# Patient Record
Sex: Male | Born: 1950 | Race: White | Hispanic: No | Marital: Married | Smoking: Never smoker
Health system: Southern US, Community
[De-identification: ages and names within clinical notes are randomized; demographics above are authoritative.]

## PROBLEM LIST (undated history)

## (undated) DIAGNOSIS — G4730 Sleep apnea, unspecified: Secondary | ICD-10-CM

## (undated) DIAGNOSIS — J302 Other seasonal allergic rhinitis: Secondary | ICD-10-CM

## (undated) DIAGNOSIS — G4733 Obstructive sleep apnea (adult) (pediatric): Secondary | ICD-10-CM

## (undated) DIAGNOSIS — T7840XA Allergy, unspecified, initial encounter: Secondary | ICD-10-CM

## (undated) HISTORY — DX: Other seasonal allergic rhinitis: J30.2

## (undated) HISTORY — DX: Obstructive sleep apnea (adult) (pediatric): G47.33

## (undated) HISTORY — PX: WISDOM TOOTH EXTRACTION: SHX21

## (undated) HISTORY — DX: Allergy, unspecified, initial encounter: T78.40XA

## (undated) HISTORY — DX: Sleep apnea, unspecified: G47.30

---

## 2005-09-20 HISTORY — PX: COLONOSCOPY: SHX174

## 2005-12-13 ENCOUNTER — Ambulatory Visit: Payer: Self-pay | Admitting: Internal Medicine

## 2005-12-15 ENCOUNTER — Ambulatory Visit: Payer: Self-pay | Admitting: Internal Medicine

## 2006-01-24 ENCOUNTER — Ambulatory Visit: Payer: Self-pay | Admitting: Gastroenterology

## 2006-02-03 ENCOUNTER — Ambulatory Visit: Payer: Self-pay | Admitting: Gastroenterology

## 2006-02-03 ENCOUNTER — Encounter (INDEPENDENT_AMBULATORY_CARE_PROVIDER_SITE_OTHER): Payer: Self-pay | Admitting: *Deleted

## 2008-01-19 ENCOUNTER — Telehealth: Payer: Self-pay | Admitting: Internal Medicine

## 2008-01-19 ENCOUNTER — Ambulatory Visit: Payer: Self-pay | Admitting: Internal Medicine

## 2008-01-19 DIAGNOSIS — R509 Fever, unspecified: Secondary | ICD-10-CM

## 2008-01-19 LAB — CONVERTED CEMR LAB
Blood in Urine, dipstick: NEGATIVE
Nitrite: NEGATIVE
Protein, U semiquant: NEGATIVE
Specific Gravity, Urine: 1.01
WBC Urine, dipstick: NEGATIVE

## 2008-01-20 ENCOUNTER — Encounter: Payer: Self-pay | Admitting: Internal Medicine

## 2008-01-22 ENCOUNTER — Telehealth (INDEPENDENT_AMBULATORY_CARE_PROVIDER_SITE_OTHER): Payer: Self-pay | Admitting: *Deleted

## 2008-01-22 LAB — CONVERTED CEMR LAB
AST: 27 units/L (ref 0–37)
Alkaline Phosphatase: 122 units/L — ABNORMAL HIGH (ref 39–117)
Basophils Absolute: 0 10*3/uL (ref 0.0–0.1)
Bilirubin, Direct: 0.1 mg/dL (ref 0.0–0.3)
Chloride: 103 meq/L (ref 96–112)
Eosinophils Absolute: 0.2 10*3/uL (ref 0.0–0.7)
GFR calc non Af Amer: 83 mL/min
MCHC: 33.7 g/dL (ref 30.0–36.0)
MCV: 90.5 fL (ref 78.0–100.0)
Neutrophils Relative %: 75.9 % (ref 43.0–77.0)
Platelets: 152 10*3/uL (ref 150–400)
Potassium: 4 meq/L (ref 3.5–5.1)
Sodium: 140 meq/L (ref 135–145)
Total Bilirubin: 0.8 mg/dL (ref 0.3–1.2)

## 2011-02-26 ENCOUNTER — Encounter: Payer: Self-pay | Admitting: Gastroenterology

## 2011-12-23 ENCOUNTER — Ambulatory Visit (INDEPENDENT_AMBULATORY_CARE_PROVIDER_SITE_OTHER): Payer: BC Managed Care – PPO | Admitting: Family Medicine

## 2011-12-23 VITALS — BP 134/80 | HR 71 | Temp 98.0°F | Wt 174.0 lb

## 2011-12-23 DIAGNOSIS — J209 Acute bronchitis, unspecified: Secondary | ICD-10-CM

## 2011-12-23 MED ORDER — BENZONATATE 200 MG PO CAPS: 200.0000 mg | ORAL_CAPSULE | Freq: Three times a day (TID) | ORAL | Status: AC | PRN

## 2011-12-23 MED ORDER — AZITHROMYCIN 250 MG PO TABS: ORAL_TABLET | ORAL | Status: AC

## 2011-12-23 NOTE — Progress Notes (Signed)
  Subjective:    Patient ID: Radley Barto, male    DOB: 1951/02/19, 61 y.o.   MRN: [id number]  HPI Cough/congestion- sxs started 6 weeks ago.  No fever.  Started OTC cold and sinus meds w/ some improvement but then sxs again returned.  Started taking Zyrtec daily w/ some improvement.  Cough persists- intermittently productive.  No ear pain, sinus pain.  + nasal congestion.  + sick contacts.   Review of Systems For ROS see HPI     Objective:   Physical Exam  Constitutional: He appears well-developed and well-nourished. No distress.  HENT:  Head: Normocephalic and atraumatic.       No TTP over sinuses + turbinate edema + PND TMs normal bilaterally  Eyes: Conjunctivae and EOM are normal. Pupils are equal, round, and reactive to light.  Neck: Normal range of motion. Neck supple.  Cardiovascular: Normal rate, regular rhythm and normal heart sounds.   Pulmonary/Chest: Effort normal and breath sounds normal. No respiratory distress. He has no wheezes.       + deep cough  Lymphadenopathy:    He has no cervical adenopathy.  Skin: Skin is warm and dry.          Assessment & Plan:

## 2011-12-23 NOTE — Assessment & Plan Note (Signed)
New.  Pt's sxs consistent w/ allergy/bronchitis combo.  Continue OTC antihistamine and add abx due to duration of illness.  Reviewed supportive care and red flags that should prompt return.  Pt expressed understanding and is in agreement w/ plan.

## 2011-12-23 NOTE — Patient Instructions (Signed)
Start the azithromycin to cover for possible bronchitis after 6 weeks of cough Use the tessalon as needed for cough Continue the Zyrtec daily Drink plenty of fluids Hang in there!

## 2012-12-12 ENCOUNTER — Encounter: Payer: Self-pay | Admitting: Internal Medicine

## 2012-12-12 ENCOUNTER — Ambulatory Visit (INDEPENDENT_AMBULATORY_CARE_PROVIDER_SITE_OTHER): Payer: BC Managed Care – PPO | Admitting: Internal Medicine

## 2012-12-12 VITALS — BP 134/74 | HR 79 | Temp 97.6°F | Wt 176.0 lb

## 2012-12-12 DIAGNOSIS — J209 Acute bronchitis, unspecified: Secondary | ICD-10-CM

## 2012-12-12 MED ORDER — AZITHROMYCIN 250 MG PO TABS: ORAL_TABLET | ORAL | Status: DC

## 2012-12-12 NOTE — Progress Notes (Signed)
  Subjective:    Patient ID: Alexis Chan, male    DOB: [birth year], 62 y.o.   MRN: [id number]  HPI Acute visit 10 days history of cough, sinus congestion, sore throat. Taking Advil cold and sinus.  No past medical history on file.   Past Surgical History  Procedure Laterality Date  . No past surgeries       History   Social History  . Marital Status: Married    Spouse Name: N/A    Number of Children: 0  . Years of Education: N/A   Occupational History  . [employer]    Social History Main Topics  . Smoking status: Never Smoker   . Smokeless tobacco: Never Used  . Alcohol Use: No  . Drug Use: No  . Sexually Active: Not on file   Other Topics Concern  . Not on file   Social History Narrative  . No narrative on file    Review of Systems No nasal discharge, occasional sputum production, yellow in color. Having some subjective fevers in the last few days. No nausea, vomiting, diarrhea. No myalgias.     Objective:   Physical Exam General -- alert, well-developed, no apparent distress  HEENT -- TMs normal, throat w/o redness, face symmetric and not tender to palpation, nose not congested  Lungs -- normal respiratory effort, no intercostal retractions, no accessory muscle use, and normal breath sounds.   Heart-- normal rate, regular rhythm, no murmur, and no gallop.    Neurologic-- alert & oriented X3 and strength normal in all extremities. Psych-- Cognition and judgment appear intact. Alert and cooperative with normal attention span and concentration.  not anxious appearing and not depressed appearing.      Assessment & Plan:   Mild bronchitis, see instructions. Recommend to schedule a physical exam at his convenience.

## 2012-12-12 NOTE — Patient Instructions (Addendum)
Rest, fluids , tylenol For cough, take Mucinex DM twice a day as needed or Advil cold and sinus Take the antibiotic as prescribed  (zithromax ) only if no better in 3-4 days Call if no better by next week Call anytime if the symptoms are severe --- Schedule a physical exam fasting at your convenience

## 2012-12-28 ENCOUNTER — Encounter: Payer: Self-pay | Admitting: Internal Medicine

## 2012-12-28 ENCOUNTER — Ambulatory Visit (INDEPENDENT_AMBULATORY_CARE_PROVIDER_SITE_OTHER): Payer: BC Managed Care – PPO | Admitting: Internal Medicine

## 2012-12-28 VITALS — BP 126/78 | HR 92 | Temp 98.3°F | Wt 173.0 lb

## 2012-12-28 DIAGNOSIS — L0390 Cellulitis, unspecified: Secondary | ICD-10-CM

## 2012-12-28 DIAGNOSIS — T148 Other injury of unspecified body region: Secondary | ICD-10-CM

## 2012-12-28 DIAGNOSIS — Z23 Encounter for immunization: Secondary | ICD-10-CM

## 2012-12-28 DIAGNOSIS — R0609 Other forms of dyspnea: Secondary | ICD-10-CM

## 2012-12-28 DIAGNOSIS — R0683 Snoring: Secondary | ICD-10-CM

## 2012-12-28 DIAGNOSIS — W57XXXA Bitten or stung by nonvenomous insect and other nonvenomous arthropods, initial encounter: Secondary | ICD-10-CM

## 2012-12-28 DIAGNOSIS — J309 Allergic rhinitis, unspecified: Secondary | ICD-10-CM

## 2012-12-28 MED ORDER — DOXYCYCLINE HYCLATE 100 MG PO TABS: 100.0000 mg | ORAL_TABLET | Freq: Two times a day (BID) | ORAL | Status: DC

## 2012-12-28 MED ORDER — FLUTICASONE PROPIONATE 50 MCG/ACT NA SUSP: 1.0000 | Freq: Two times a day (BID) | NASAL | Status: DC | PRN

## 2012-12-28 NOTE — Progress Notes (Signed)
  Subjective:    Patient ID: Alexis Chan, male    DOB: 1951-02-03, 62 y.o.   MRN: [id number]  HPI  He noted itching in the left flank area 12/23/12. His wife noted a tick which he removed with tweezers. The itching resolved with Benadryl.  He denies associated nausea, vomiting, fever, or chills. He also has not had significant headache or progressive rash.  His wife didn't want him checked as there has been a persistent lesion at the site of the bite.    Review of Systems  He was recently treated for bronchitis with a Z-Pak which he has completed. He also has been using Zyrtec-D for extrinsic symptoms. 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  His wife is concerned because of significant snoring and questions sleep apnea     Objective:   Physical Exam  General appearance:good health ;well nourished; no acute distress or increased work of breathing is present.  No  lymphadenopathy about the head, neck, or axilla noted.   Eyes: No conjunctival inflammation or lid edema is present.   Ears:  External ear exam shows no significant lesions or deformities.  Otoscopic examination reveals clear canals, tympanic membranes are intact bilaterally without bulging, retraction, inflammation or discharge.  Nose:  External nasal examination shows no deformity or inflammation. Nasal mucosa are pink and moist without lesions or exudates. No septal dislocation or deviation.No obstruction to airflow.   Oral exam: Dental hygiene is good; lips and gums are healthy appearing.There is no oropharyngeal erythema or exudate noted.   Neck:  No deformities, masses, or tenderness noted.   Supple with full range of motion without pain.   Heart:  Normal rate and regular rhythm. S1 and S2 normal without gallop, murmur, click, rub or other extra sounds.   Lungs:Chest clear to auscultation; no wheezes, rhonchi,rales ,or rubs present.No increased work of breathing.    Extremities:  No  cyanosis, edema, or clubbing  noted    Skin: 1.5 X 2 cm L flank cellulitis.         Assessment & Plan:  #1 cellulitis at the site of tick bite; active tick borne illness clinically not present  #2 snoring, excessive by history he  Plan: See orders and recommendations

## 2012-12-28 NOTE — Patient Instructions (Addendum)
Plain Mucinex (NOT D) for thick secretions ;force NON dairy fluids .   Nasal cleansing in the shower as discussed with lather of mild shampoo.After 10 seconds wash off lather while  exhaling through nostrils. Make sure that all residual soap is removed to prevent irritation.  Fluticasone 1 spray in each nostril twice a day as needed. Use the "crossover" technique into opposite nostril spraying toward opposite ear @ 45 degree angle, not straight up into nostril.  Use a Neti pot daily only  as needed for significant sinus congestion; going from open side to congested side . Plain Allegra (NOT D )  160 daily , Loratidine 10 mg , OR Zyrtec 10 mg @ bedtime  as needed for itchy eyes & sneezing. With Doxycycline direct sun exposure should be avoided as much as possible as photosensitivity skin rash  is a potential reaction.

## 2013-01-30 ENCOUNTER — Encounter: Payer: Self-pay | Admitting: Pulmonary Disease

## 2013-01-30 ENCOUNTER — Ambulatory Visit (INDEPENDENT_AMBULATORY_CARE_PROVIDER_SITE_OTHER): Payer: BC Managed Care – PPO | Admitting: Pulmonary Disease

## 2013-01-30 VITALS — BP 122/62 | HR 75 | Temp 98.0°F | Ht 68.0 in | Wt 172.0 lb

## 2013-01-30 DIAGNOSIS — G4733 Obstructive sleep apnea (adult) (pediatric): Secondary | ICD-10-CM

## 2013-01-30 DIAGNOSIS — R0683 Snoring: Secondary | ICD-10-CM

## 2013-01-30 DIAGNOSIS — R0609 Other forms of dyspnea: Secondary | ICD-10-CM

## 2013-01-30 HISTORY — DX: Obstructive sleep apnea (adult) (pediatric): G47.33

## 2013-01-30 NOTE — Assessment & Plan Note (Signed)
He has snoring, sleep disruption, witnessed apnea, and daytime sleepiness.  I am concerned he could have sleep apnea.  We discussed how sleep apnea can affect various health problems including risks for hypertension, cardiovascular disease, and diabetes.  We also discussed how sleep disruption can increase risks for accident, such as while driving.  Weight loss as a means of improving sleep apnea was also reviewed.  Additional treatment options discussed were CPAP therapy, oral appliance, and surgical intervention.  To further assess will arrange for in lab sleep study.  

## 2013-01-30 NOTE — Patient Instructions (Signed)
Will arrange for sleep study Will call to arrange for follow up after sleep study reviewed 

## 2013-01-30 NOTE — Progress Notes (Signed)
Chief Complaint  Patient presents with  . Advice Only    Per spouse her stops breathing at night and he snores. At times he wakes up tired in the mornings.     History of Present Illness: Alexis Chan is a 62 y.o. male for evaluation of sleep problems.  During recent visit with PCP his wife made a comment that he snores, and stops breathing while asleep.  He will then wake up briefly and catch his breath.  He is not aware of this.  He does get mouth dryness at night, and has trouble sleeping on his back.  He does not dream much. He will fall asleep if he is reading or working on a computer.  He is a restless sleeper.  He goes to sleep at 12 am.  He falls asleep quickly.  He wakes up 2 times to use the bathroom.  He gets out of bed at 730 am.  He occasionally feels tired in the morning.  He started using B vitamin supplement, and this has helped his energy level.  He denies morning headache.  He does not use anything to help him fall sleep.  He drinks tea in the morning.  He denies sleep walking, sleep talking, bruxism, or nightmares.  There is no history of restless legs.  He denies sleep hallucinations, sleep paralysis, or cataplexy.  The Epworth score is 5 out of 24.  Tests:   Alexis Chan  has a past medical history of Seasonal allergies.  Alexis Chan  has past surgical history that includes No past surgeries.  Prior to Admission medications   Medication Sig Start Date End Date Taking? Authorizing Provider  Cetirizine HCl (ZYRTEC ALLERGY PO) Take by mouth daily.   Yes Historical Provider, MD  fluticasone (FLONASE) 50 MCG/ACT nasal spray Place 1 spray into the nose 2 (two) times daily as needed for rhinitis. 12/28/12  Yes Pecola Lawless, MD  Multiple Vitamin (MULTIVITAMIN) tablet Take 1 tablet by mouth daily.   Yes Historical Provider, MD    No Known Allergies  His family history is not on file.  He  reports that he has never smoked. He has never used  smokeless tobacco. He reports that he does not drink alcohol or use illicit drugs.  Review of Systems  Constitutional: Negative for appetite change and unexpected weight change.  HENT: Negative for ear pain, congestion, sore throat, sneezing, trouble swallowing and dental problem.   Respiratory: Negative for cough and shortness of breath.   Cardiovascular: Negative for chest pain, palpitations and leg swelling.  Gastrointestinal: Negative for abdominal pain.  Musculoskeletal: Negative for joint swelling.  Skin: Negative for rash.  Neurological: Negative for headaches.  Psychiatric/Behavioral: Negative for dysphoric mood. The patient is not nervous/anxious.    Physical Exam:  General - No distress ENT - No sinus tenderness, no oral exudate, MP 2, decreased AP diameter, no LAN, no thyromegaly, TM clear, pupils equal/reactive Cardiac - s1s2 regular, no murmur, pulses symmetric Chest - No wheeze/rales/dullness, good air entry, normal respiratory excursion Back - No focal tenderness Abd - Soft, non-tender, no organomegaly, + bowel sounds Ext - No edema Neuro - Normal strength, cranial nerves intact Skin - No rashes Psych - Normal mood, and behavior

## 2013-01-30 NOTE — Progress Notes (Deleted)
  Subjective:    Patient ID: Alexis Chan, male    DOB: 02/11/1951, 62 y.o.   MRN: [id number]  HPI    Review of Systems  Constitutional: Negative for appetite change and unexpected weight change.  HENT: Negative for ear pain, congestion, sore throat, sneezing, trouble swallowing and dental problem.   Respiratory: Negative for cough and shortness of breath.   Cardiovascular: Negative for chest pain, palpitations and leg swelling.  Gastrointestinal: Negative for abdominal pain.  Musculoskeletal: Negative for joint swelling.  Skin: Negative for rash.  Neurological: Negative for headaches.  Psychiatric/Behavioral: Negative for dysphoric mood. The patient is not nervous/anxious.        Objective:   Physical Exam        Assessment & Plan:

## 2013-02-19 ENCOUNTER — Ambulatory Visit (HOSPITAL_BASED_OUTPATIENT_CLINIC_OR_DEPARTMENT_OTHER): Payer: BC Managed Care – PPO | Attending: Pulmonary Disease | Admitting: Radiology

## 2013-02-19 VITALS — Ht 68.0 in | Wt 165.0 lb

## 2013-02-19 DIAGNOSIS — G4733 Obstructive sleep apnea (adult) (pediatric): Secondary | ICD-10-CM | POA: Insufficient documentation

## 2013-02-19 DIAGNOSIS — R0683 Snoring: Secondary | ICD-10-CM

## 2013-02-19 DIAGNOSIS — R0609 Other forms of dyspnea: Secondary | ICD-10-CM | POA: Insufficient documentation

## 2013-02-19 DIAGNOSIS — R0989 Other specified symptoms and signs involving the circulatory and respiratory systems: Secondary | ICD-10-CM | POA: Insufficient documentation

## 2013-02-19 DIAGNOSIS — G4761 Periodic limb movement disorder: Secondary | ICD-10-CM | POA: Insufficient documentation

## 2013-02-28 ENCOUNTER — Telehealth: Payer: Self-pay | Admitting: Pulmonary Disease

## 2013-02-28 DIAGNOSIS — G4761 Periodic limb movement disorder: Secondary | ICD-10-CM

## 2013-02-28 DIAGNOSIS — G4733 Obstructive sleep apnea (adult) (pediatric): Secondary | ICD-10-CM

## 2013-02-28 NOTE — Telephone Encounter (Signed)
Spoke to pt. He has been scheduled for 03/16/13 at 2:00pm. Nothing further was needed.

## 2013-02-28 NOTE — Telephone Encounter (Signed)
PSG 02/19/13 >> AHI 33.3, SpO2 low 78%, PLMI 20.  REM AHI 60, Supine AHI 50.  Will have my nurse schedule ROV to review results >> okay to double book visit.

## 2013-03-01 NOTE — Procedures (Signed)
NAME:  Alexis Chan, Alexis Chan NO.:  [id number]  MEDICAL RECORD NO.:  [id number]          PATIENT TYPE:  OUT  LOCATION:  SLEEP CENTER                 FACILITY:  [facility]  PHYSICIAN:  Coralyn Helling, MD        DATE OF BIRTH:  10/06/1950  DATE OF STUDY:  02/19/2013                           NOCTURNAL POLYSOMNOGRAM  REFERRING PHYSICIAN:  Coralyn Helling, MD  INDICATION FOR STUDY:  Mr. Mazon is a 62 year old male, who reports snoring, sleep disruption, and daytime sleepiness.  He is referred to sleep lab for evaluation of hypersomnia with obstructive sleep apnea.  Height is 5 feet 8 inches, weight is 165 pounds, BMI is 25, neck size is 15 inches.  EPWORTH SLEEPINESS SCORE:  7.  MEDICATIONS:  Zyrtec.  SLEEP ARCHITECTURE:  Total recording time was 378 minutes.  Total sleep time was 324 minutes.  Sleep efficiency was 84%.  Sleep latency was 15 minutes.  REM latency was 284 minutes.  The study was notable for lack of slow-wave sleep.  He slept in both the supine and nonsupine positions.  RESPIRATORY DATA:  The average respiratory rate was 18.  Moderate snoring was noted by the technician.  The overall apnea/hypopnea index was 33.3.  There were 6 central apneic events, and 3 mixed respiratory events.  The remainder of the events were obstructive in nature.  The supine apnea-hypopnea index was 15.  The nonsupine apnea-hypopnea index was 0.7.  The REM apnea-hypopnea index was 60.  The non-REM apnea- hypopnea index was 29.4.  OXYGEN DATA:  The baseline oxygenation was 94%.  The oxygen saturation nadir was 78%.  The patient spent a total of 9.6 minutes with an oxygen saturation below 80%.  The study was conducted without the use of supplemental oxygen.  CARDIAC DATA:  The average heart rate was 72 and the rhythm strip showed sinus rhythm with occasional PVCs and PACs.  MOVEMENT-PARASOMNIA:  The periodic limb movement index was 20 and the patient had no restroom  trips.  IMPRESSION:  This study shows evidence for severe obstructive sleep apnea with an apnea/hypopnea index of 33.3 and an oxygen saturation nadir of 78%.  He did have a significant REM and positional effect to sleep- disordered breathing.  He had an increase in his periodic limb movement index and clinical correlation would be necessary to determine the significance of this.  Additional therapeutic interventions for his obstructive sleep apnea could include positional therapy, CPAP therapy, oral appliance, or surgical intervention.     Coralyn Helling, MD Diplomat, American Board of Sleep Medicine    VS/MEDQ  D:  02/28/2013 11:14:07  T:  03/01/2013 04:25:18  Job:  [id number]

## 2013-03-16 ENCOUNTER — Encounter: Payer: Self-pay | Admitting: Pulmonary Disease

## 2013-03-16 ENCOUNTER — Ambulatory Visit (INDEPENDENT_AMBULATORY_CARE_PROVIDER_SITE_OTHER): Payer: BC Managed Care – PPO | Admitting: Pulmonary Disease

## 2013-03-16 VITALS — BP 116/70 | HR 59 | Temp 97.9°F | Ht 68.0 in | Wt 173.0 lb

## 2013-03-16 DIAGNOSIS — G4733 Obstructive sleep apnea (adult) (pediatric): Secondary | ICD-10-CM

## 2013-03-16 NOTE — Progress Notes (Signed)
Chief Complaint  Patient presents with  . Sleep Apnea    Sleep Study Results    History of Present Illness: Alexis Chan is a 62 y.o. male with OSA.  He is here to review his sleep study.  This showed severe sleep apnea.  TESTS: PSG 02/19/13 >> AHI 33.3, SpO2 low 78%, PLMI 20.  REM AHI 60, Supine AHI 50.  Alexis Chan  has a past medical history of Seasonal allergies.  Alexis Chan  has past surgical history that includes No past surgeries.  Prior to Admission medications   Medication Sig Start Date End Date Taking? Authorizing Provider  Cetirizine HCl (ZYRTEC ALLERGY PO) Take by mouth daily.   Yes Historical Provider, MD  fluticasone (FLONASE) 50 MCG/ACT nasal spray Place 1 spray into the nose 2 (two) times daily as needed for rhinitis. 12/28/12  Yes Pecola Lawless, MD  Multiple Vitamin (MULTIVITAMIN) tablet Take 1 tablet by mouth daily.   Yes Historical Provider, MD    No Known Allergies   Physical Exam:  General - No distress ENT - No sinus tenderness, MP 2, decreased AP diameter, no oral exudate, no LAN Cardiac - s1s2 regular, no murmur Chest - No wheeze/rales/dullness Back - No focal tenderness Abd - Soft, non-tender Ext - No edema Neuro - Normal strength Skin - No rashes Psych - normal mood, and behavior   Assessment/Plan:  Coralyn Helling, MD [place] [facility]/Critical Care/Sleep Pager:  [phone]

## 2013-03-16 NOTE — Patient Instructions (Signed)
Will arrange for CPAP set up Follow up in 2 months 

## 2013-03-16 NOTE — Assessment & Plan Note (Signed)
He has severe OSA.   I have reviewed the recent sleep study results with the patient.  We discussed how sleep apnea can affect various health problems including risks for hypertension, cardiovascular disease, and diabetes.  We also discussed how sleep disruption can increase risks for accident, such as while driving.  Weight loss as a means of improving sleep apnea was also reviewed.  Additional treatment options discussed were CPAP therapy, oral appliance, and surgical intervention.  Will arrange for auto CPAP set up. 

## 2013-04-11 ENCOUNTER — Telehealth: Payer: Self-pay | Admitting: Pulmonary Disease

## 2013-04-11 NOTE — Telephone Encounter (Signed)
Auto CPAP 03/26/13 to 04/08/13 >> used on 13 of 14 nights with average 7 hrs 14 min.  Average AHI 2.6 with median CPAP 9 cm H2O and 95 th percentile CPAP 13 cm H2O.  Will have my nurse inform pt that CPAP reports looks good.  No change to current set up.  Will discuss in more detail at [facility] in August.

## 2013-04-11 NOTE — Telephone Encounter (Signed)
Pt is aware of download report results. 

## 2013-05-18 ENCOUNTER — Encounter: Payer: Self-pay | Admitting: Pulmonary Disease

## 2013-05-18 ENCOUNTER — Ambulatory Visit (INDEPENDENT_AMBULATORY_CARE_PROVIDER_SITE_OTHER): Payer: BC Managed Care – PPO | Admitting: Pulmonary Disease

## 2013-05-18 VITALS — BP 108/70 | HR 68 | Ht 68.0 in | Wt 177.0 lb

## 2013-05-18 DIAGNOSIS — G4733 Obstructive sleep apnea (adult) (pediatric): Secondary | ICD-10-CM

## 2013-05-18 NOTE — Assessment & Plan Note (Addendum)
He is compliant with therapy and reports benefit from CPAP.  Will continue with auto CPAP.  Advised he could try using zyrtec during the day to avoid drinking water at night, and thus avoid having to wake up to use the bathroom.

## 2013-05-18 NOTE — Patient Instructions (Signed)
Follow up in 1 year.

## 2013-05-18 NOTE — Progress Notes (Signed)
Chief Complaint  Patient presents with  . Sleep Apnea    Currently using CPAP for 7 hours per night. Does not feel well rested the next day after use. Denies problems with the machine, mask or pressure.    History of Present Illness: Alexis Chan is a 62 y.o. male with OSA.  He is sleeping better and feeling better.  He drinks water before going to bed to help swallow his zyrtec.  He then has to wake up to use the bathroom.  He is not having any trouble with his mask.  He still gets tired, but feels he has more reserve in his energy level.   TESTS: PSG 02/19/13 >> AHI 33.3, SpO2 low 78%, PLMI 20.  REM AHI 60, Supine AHI 50. Auto CPAP 03/27/13 to 05/17/13 >> Used on 52 of 52 nights with average 7 hrs 18 min.  Average AHI 2.5 with median CPAP 9 cm H2O and 95th percentile CPAP 13 cm H2O.   Waldemar Siegel  has a past medical history of Seasonal allergies.  Harace Mccluney  has past surgical history that includes No past surgeries.  Prior to Admission medications   Medication Sig Start Date End Date Taking? Authorizing Provider  Cetirizine HCl (ZYRTEC ALLERGY PO) Take by mouth daily.   Yes Historical Provider, MD  fluticasone (FLONASE) 50 MCG/ACT nasal spray Place 1 spray into the nose 2 (two) times daily as needed for rhinitis. 12/28/12  Yes Pecola Lawless, MD  Multiple Vitamin (MULTIVITAMIN) tablet Take 1 tablet by mouth daily.   Yes Historical Provider, MD    No Known Allergies   Physical Exam:  General - No distress ENT - No sinus tenderness, MP 2, decreased AP diameter, no oral exudate, no LAN Cardiac - s1s2 regular, no murmur Chest - No wheeze/rales/dullness Back - No focal tenderness Abd - Soft, non-tender Ext - No edema Neuro - Normal strength Skin - No rashes Psych - normal mood, and behavior   Assessment/Plan:  Coralyn Helling, MD [place] [facility]/Critical Care/Sleep Pager:  [phone]

## 2014-01-22 ENCOUNTER — Other Ambulatory Visit: Payer: Self-pay | Admitting: *Deleted

## 2014-01-22 MED ORDER — FLUTICASONE PROPIONATE 50 MCG/ACT NA SUSP: NASAL | Status: AC

## 2014-01-22 NOTE — Telephone Encounter (Signed)
Rx sent to the pharmacy by e-script.  Pt needs office visit.//AB/CMA 

## 2014-08-05 ENCOUNTER — Encounter (INDEPENDENT_AMBULATORY_CARE_PROVIDER_SITE_OTHER): Payer: Self-pay

## 2014-08-05 ENCOUNTER — Ambulatory Visit (INDEPENDENT_AMBULATORY_CARE_PROVIDER_SITE_OTHER): Payer: BC Managed Care – PPO | Admitting: Pulmonary Disease

## 2014-08-05 ENCOUNTER — Encounter: Payer: Self-pay | Admitting: Pulmonary Disease

## 2014-08-05 VITALS — BP 128/70 | HR 72 | Temp 98.2°F | Ht 68.0 in | Wt 186.0 lb

## 2014-08-05 DIAGNOSIS — G4733 Obstructive sleep apnea (adult) (pediatric): Secondary | ICD-10-CM

## 2014-08-05 NOTE — Assessment & Plan Note (Signed)
He is compliant with therapy and reports benefit.  Discussed appropriate mask fit.  Advised him to d/w his DME about getting new mask.

## 2014-08-05 NOTE — Patient Instructions (Signed)
Check website for [employer], [employer], and [employer] for different mask types Follow up in 1 year

## 2014-08-05 NOTE — Progress Notes (Signed)
Chief Complaint  Patient presents with  . Follow-up    Wears CPAP nightly. Denies pressure or mask issues. Humidifier missing parts. Needs new supplies.    History of Present Illness: Alexis Chan is a 63 y.o. male with OSA.  He has been using CPAP every night.  This helps his sleep.  He wakes up occasionally to use the bathroom.  He feels rested during the day.  He was having trouble with mask leak >> better when he tightens mask, but then gets marks on his face from mask.    TESTS: PSG 02/19/13 >> AHI 33.3, SpO2 low 78%, PLMI 20.  REM AHI 60, Supine AHI 50. Auto CPAP 03/27/13 to 05/17/13 >> Used on 52 of 52 nights with average 7 hrs 18 min.  Average AHI 2.5 with median CPAP 9 cm H2O and 95th percentile CPAP 13 cm H2O.  Auto CPAP 07/06/14 to 08/04/14 >> used on 30 of 30 nights with average 8 hrs and 15 min.  Average AHI is 3.1 with median CPAP 10 cm H2O and 95 th percentile CPAP 13 cm H20.   PMHx, PSHx, Medications, Allergies, Fhx, Shx reviewed.   Physical Exam:  General - No distress ENT - No sinus tenderness, MP 2, decreased AP diameter, no oral exudate, no LAN Cardiac - s1s2 regular, no murmur Chest - No wheeze/rales/dullness Back - No focal tenderness Abd - Soft, non-tender Ext - No edema Neuro - Normal strength Skin - No rashes Psych - normal mood, and behavior   Assessment/Plan:  Chesley Mires, MD [place] [facility]/Critical Care/Sleep Pager:  [phone]

## 2014-08-06 ENCOUNTER — Telehealth: Payer: Self-pay | Admitting: Internal Medicine

## 2014-08-06 DIAGNOSIS — Z1211 Encounter for screening for malignant neoplasm of colon: Secondary | ICD-10-CM

## 2014-08-06 NOTE — Telephone Encounter (Signed)
Caller name: Jayen, [place] Relation to WN:IOEV  Call back number: [phone]   Reason for call:  Pt requesting a referral for colonoscopy

## 2014-08-06 NOTE — Telephone Encounter (Signed)
Please advise 

## 2014-08-06 NOTE — Telephone Encounter (Signed)
Referral placed to GI 

## 2014-08-06 NOTE — Telephone Encounter (Signed)
63 year old gentleman appropriate to have a colonoscopy----------->  Please arrange a GI referral. (I notice he has an appointment with me in February 2016.)

## 2014-11-05 ENCOUNTER — Ambulatory Visit (INDEPENDENT_AMBULATORY_CARE_PROVIDER_SITE_OTHER): Payer: BC Managed Care – PPO | Admitting: Internal Medicine

## 2014-11-05 ENCOUNTER — Encounter: Payer: Self-pay | Admitting: Internal Medicine

## 2014-11-05 VITALS — BP 125/82 | HR 59 | Temp 97.7°F | Ht 68.0 in | Wt 180.5 lb

## 2014-11-05 DIAGNOSIS — Z0000 Encounter for general adult medical examination without abnormal findings: Secondary | ICD-10-CM | POA: Insufficient documentation

## 2014-11-05 LAB — CBC WITH DIFFERENTIAL/PLATELET
Basophils Absolute: 0 10*3/uL (ref 0.0–0.1)
Basophils Relative: 0.3 % (ref 0.0–3.0)
EOS PCT: 3.2 % (ref 0.0–5.0)
Eosinophils Absolute: 0.3 10*3/uL (ref 0.0–0.7)
HEMATOCRIT: 45.2 % (ref 39.0–52.0)
Hemoglobin: 15.4 g/dL (ref 13.0–17.0)
Lymphocytes Relative: 15.2 % (ref 12.0–46.0)
Lymphs Abs: 1.4 10*3/uL (ref 0.7–4.0)
MCHC: 34.2 g/dL (ref 30.0–36.0)
MCV: 89.3 fl (ref 78.0–100.0)
Monocytes Absolute: 0.9 10*3/uL (ref 0.1–1.0)
Monocytes Relative: 9.2 % (ref 3.0–12.0)
Neutro Abs: 6.7 10*3/uL (ref 1.4–7.7)
Neutrophils Relative %: 72.1 % (ref 43.0–77.0)
PLATELETS: 184 10*3/uL (ref 150.0–400.0)
RBC: 5.06 Mil/uL (ref 4.22–5.81)
RDW: 13.1 % (ref 11.5–15.5)
WBC: 9.3 10*3/uL (ref 4.0–10.5)

## 2014-11-05 LAB — COMPREHENSIVE METABOLIC PANEL
ALT: 27 U/L (ref 0–53)
AST: 19 U/L (ref 0–37)
Albumin: 4.2 g/dL (ref 3.5–5.2)
Alkaline Phosphatase: 115 U/L (ref 39–117)
BUN: 13 mg/dL (ref 6–23)
CALCIUM: 9.6 mg/dL (ref 8.4–10.5)
CO2: 32 meq/L (ref 19–32)
CREATININE: 1.15 mg/dL (ref 0.40–1.50)
Chloride: 104 mEq/L (ref 96–112)
GFR: 68.59 mL/min (ref >60.00)
Glucose, Bld: 94 mg/dL (ref 70–99)
POTASSIUM: 4.3 meq/L (ref 3.5–5.1)
Sodium: 141 mEq/L (ref 135–145)
TOTAL PROTEIN: 6.8 g/dL (ref 6.0–8.3)
Total Bilirubin: 0.6 mg/dL (ref 0.2–1.2)

## 2014-11-05 LAB — TSH: TSH: 3.16 u[IU]/mL (ref 0.35–4.50)

## 2014-11-05 LAB — LIPID PANEL
CHOLESTEROL: 171 mg/dL (ref 0–200)
HDL: 39.7 mg/dL (ref >39.00)
LDL Cholesterol: 95 mg/dL (ref 0–99)
NonHDL: 131.3
TRIGLYCERIDES: 180 mg/dL — AB (ref 0.0–149.0)
Total CHOL/HDL Ratio: 4
VLDL: 36 mg/dL (ref 0.0–40.0)

## 2014-11-05 LAB — PSA: PSA: 0.63 ng/mL (ref 0.10–4.00)

## 2014-11-05 NOTE — Progress Notes (Signed)
Pre visit review using our clinic review tool, if applicable. No additional management support is needed unless otherwise documented below in the visit note. 

## 2014-11-05 NOTE — Progress Notes (Signed)
Subjective:    Patient ID: Alexis Chan, male    DOB: September 02, 1951, 64 y.o.   MRN: [id number]  DOS:  11/05/2014 Type of visit - description : cpx Interval history: In general feeling well. Did have some fatigue that resolved after he started taking iron OTC. Many years history of erectile dysfunction, when asked, admits also to some decreased libido, never tried any medication. Uses a CPAP machine consistently. Long history of dry skin   Review of Systems Constitutional: No fever, chills. No unexplained wt changes. No unusual sweats HEENT: No dental problems, ear discharge, facial swelling, voice changes. No eye discharge, redness or intolerance to light Respiratory: No wheezing or difficulty breathing. No cough , mucus production Cardiovascular: No CP, leg swelling or palpitations GI: no nausea, vomiting, diarrhea or abdominal pain.  No blood in the stools. No dysphagia   Endocrine: No polyphagia, polyuria or polydipsia GU: No dysuria, gross hematuria, difficulty urinating. No urinary urgency or frequency. Musculoskeletal: No joint swellings or unusual aches or pains Skin: No change in the color of the skin, palor or rash Allergic, immunologic: No environmental allergies or food allergies Neurological: No dizziness or syncope. No headaches. No diplopia, slurred speech, motor deficits, facial numbness Hematological: No enlarged lymph nodes, easy bruising or bleeding Psychiatry: No suicidal ideas, hallucinations, behavior problems or confusion. No unusual/severe anxiety or depression.      Past Medical History  Diagnosis Date  . Seasonal allergies   . OSA (obstructive sleep apnea) 01/30/2013    Past Surgical History  Procedure Laterality Date  . No past surgeries      History   Social History  . Marital Status: Married    Spouse Name: N/A  . Number of Children: 0  . Years of Education: N/A   Occupational History  . [employer] of [employer]    Social History  Main Topics  . Smoking status: Never Smoker   . Smokeless tobacco: Never Used  . Alcohol Use: No  . Drug Use: No  . Sexual Activity: Not on file   Other Topics Concern  . Not on file   Social History Narrative   Household-- pt and wife   Mother is 12, still living independently        Medication List       This list is accurate as of: 11/05/14  6:58 PM.  Always use your most recent med list.               B-12 2000 MCG Tabs  Take 1 tablet by mouth daily.     FLEXI JOINT PO  Take 2 tablets by mouth daily.     fluticasone 50 MCG/ACT nasal spray  Commonly known as:  FLONASE  PT NEEDS OFFICE VISIT.  PLACE 1 SPRAY INTO THE NOSE 2 TIMES DAILY AS NEEDED FOR RHINITIS.     IRON PO  Take 1 tablet by mouth daily.           Objective:   Physical Exam BP 125/82 mmHg  Pulse 59  Temp(Src) 97.7 F (36.5 C) (Oral)  Ht 5\' 8"  (1.727 m)  Wt 180 lb 8 oz (81.874 kg)  BMI 27.45 kg/m2  SpO2 95% General:   Well developed, well nourished . NAD.  Neck:  Full range of motion. Supple. No  thyromegaly , normal carotid pulse, no LAD. HEENT:  Normocephalic . Face symmetric, atraumatic Lungs:  CTA B Normal respiratory effort, no intercostal retractions, no accessory muscle use. Heart: RRR,  no  murmur.  Abdomen:  Not distended, soft, non-tender. No rebound or rigidity. No mass,organomegaly. No bruit Muscle skeletal: no pretibial edema bilaterally ; normal femoral pulses bilaterally Skin: Exposed areas without rash. Not pale. Not jaundice. Slightly dry skin Rectal:  External abnormalities: none. Normal sphincter tone. No rectal masses or tenderness.  Stool brown  Prostate: Prostate gland firm and smooth, no enlargement, nodularity, tenderness, mass, asymmetry or induration. Neurologic:  alert & oriented X3.  Speech normal, gait appropriate for age and unassisted Strength symmetric and appropriate for age.  Psych--  Cognition and judgment appear intact.  Cooperative with  normal attention span and concentration.  Behavior appropriate. No anxious or depressed appearing.      Assessment & Plan:

## 2014-11-05 NOTE — Assessment & Plan Note (Addendum)
Td 2014 Had a colonoscopy before, per GI letter next year 2017, patient aware Prostate cancer screening, DRE neg, check a PSA Diet and exercise discussed EKG today sinus bradycardia Return to clinic 1 year  Other issues: Dry skin: Avoid  very hot showers, use Aveeno. Erectile dysfunction, decreased libido: Not a major problem to the patient however we discussed medication such as Viagra, will call if interested. Check a testosterone level Mildly fatigue, resolved after she took OTC iron, check labs

## 2014-11-05 NOTE — Patient Instructions (Signed)
Get your blood work before you leave   Please come back to the office in 1 year  for a routine check up

## 2014-11-06 LAB — TESTOSTERONE, FREE, TOTAL, SHBG
Sex Hormone Binding: 41 nmol/L (ref 22–77)
Testosterone, Free: 73.2 pg/mL (ref 47.0–244.0)
Testosterone-% Free: 1.8 % (ref 1.6–2.9)
Testosterone: 411 ng/dL (ref 300–890)

## 2015-10-10 ENCOUNTER — Encounter: Payer: Self-pay | Admitting: Pulmonary Disease

## 2015-10-10 ENCOUNTER — Ambulatory Visit (INDEPENDENT_AMBULATORY_CARE_PROVIDER_SITE_OTHER): Payer: BC Managed Care – PPO | Admitting: Pulmonary Disease

## 2015-10-10 VITALS — BP 120/60 | HR 74 | Ht 68.0 in | Wt 182.0 lb

## 2015-10-10 DIAGNOSIS — Z23 Encounter for immunization: Secondary | ICD-10-CM

## 2015-10-10 DIAGNOSIS — G4733 Obstructive sleep apnea (adult) (pediatric): Secondary | ICD-10-CM

## 2015-10-10 DIAGNOSIS — Z9989 Dependence on other enabling machines and devices: Secondary | ICD-10-CM

## 2015-10-10 NOTE — Patient Instructions (Signed)
Will arrange for new CPAP mask and supplies  Follow up in 1 year

## 2015-10-10 NOTE — Progress Notes (Signed)
Current Outpatient Prescriptions on File Prior to Visit  Medication Sig  . fluticasone (FLONASE) 50 MCG/ACT nasal spray PT NEEDS OFFICE VISIT.  PLACE 1 SPRAY INTO THE NOSE 2 TIMES DAILY AS NEEDED FOR RHINITIS.   No current facility-administered medications on file prior to visit.     Chief Complaint  Patient presents with  . Follow-up    Wears CPAP nightly. Needs new supplies.      Tests PSG 02/19/13 >> AHI 33.3, SpO2 low 78%, PLMI 20. REM AHI 60, Supine AHI 50. Auto CPAP 07/13/15 to 10/10/15 >> used on 89 of 90 nights with average 8 hrs and 3 min. Average AHI is 2.2 with median CPAP 9 cm H2O and 95 th percentile CPAP 13 cm H20.  Past medical hx Allergies  Past surgical hx, Allergies, Family hx, Social hx all reviewed.  Vital Signs BP 120/60 mmHg  Pulse 74  Ht 5\' 8"  (1.727 m)  Wt 182 lb (82.555 kg)  BMI 27.68 kg/m2  SpO2 96%  History of Present Illness Alexis Chan is a 65 y.o. male with OSA.  He has been using CPAP nightly.  He needs new mask and strap.  His guard for transmitter fell off.  He feels rested.   Physical Exam  General - No distress ENT - No sinus tenderness, no oral exudate, no LAN, MP3 Cardiac - s1s2 regular, no murmur Chest - No wheeze/rales/dullness Back - No focal tenderness Abd - Soft, non-tender Ext - No edema Neuro - Normal strength Skin - No rashes Psych - normal mood, and behavior   Assessment/Plan  Obstructive sleep apnea. Plan: - continue auto CPAP - will arrange for new mask and supplies   Patient Instructions  Will arrange for new CPAP mask and supplies  Follow up in 1 year     Chesley Mires, MD [facility] Pager:  [phone]

## 2015-10-22 ENCOUNTER — Encounter: Payer: Self-pay | Admitting: Gastroenterology

## 2015-11-11 ENCOUNTER — Telehealth: Payer: Self-pay | Admitting: *Deleted

## 2015-11-11 NOTE — Telephone Encounter (Signed)
Unable to reach patient at time of pre-visit call. Left message for patient to return call when available.  

## 2015-11-12 ENCOUNTER — Encounter: Payer: Self-pay | Admitting: Internal Medicine

## 2015-11-12 ENCOUNTER — Ambulatory Visit (INDEPENDENT_AMBULATORY_CARE_PROVIDER_SITE_OTHER): Payer: BC Managed Care – PPO | Admitting: Internal Medicine

## 2015-11-12 VITALS — BP 118/66 | HR 57 | Temp 98.1°F | Ht 68.0 in | Wt 177.1 lb

## 2015-11-12 DIAGNOSIS — Z09 Encounter for follow-up examination after completed treatment for conditions other than malignant neoplasm: Secondary | ICD-10-CM

## 2015-11-12 DIAGNOSIS — Z0000 Encounter for general adult medical examination without abnormal findings: Secondary | ICD-10-CM | POA: Diagnosis not present

## 2015-11-12 LAB — CBC WITH DIFFERENTIAL/PLATELET
BASOS PCT: 0.3 % (ref 0.0–3.0)
Basophils Absolute: 0 10*3/uL (ref 0.0–0.1)
EOS PCT: 4.9 % (ref 0.0–5.0)
Eosinophils Absolute: 0.5 10*3/uL (ref 0.0–0.7)
HEMATOCRIT: 43 % (ref 39.0–52.0)
HEMOGLOBIN: 14.8 g/dL (ref 13.0–17.0)
LYMPHS PCT: 15.6 % (ref 12.0–46.0)
Lymphs Abs: 1.5 10*3/uL (ref 0.7–4.0)
MCHC: 34.3 g/dL (ref 30.0–36.0)
MCV: 88.7 fl (ref 78.0–100.0)
Monocytes Absolute: 0.9 10*3/uL (ref 0.1–1.0)
Monocytes Relative: 9.2 % (ref 3.0–12.0)
Neutro Abs: 6.6 10*3/uL (ref 1.4–7.7)
Neutrophils Relative %: 70 % (ref 43.0–77.0)
Platelets: 198 10*3/uL (ref 150.0–400.0)
RBC: 4.85 Mil/uL (ref 4.22–5.81)
RDW: 13.4 % (ref 11.5–15.5)
WBC: 9.4 10*3/uL (ref 4.0–10.5)

## 2015-11-12 LAB — LIPID PANEL
CHOLESTEROL: 150 mg/dL (ref 0–200)
HDL: 44.1 mg/dL (ref >39.00)
LDL Cholesterol: 87 mg/dL (ref 0–99)
NONHDL: 105.66
Total CHOL/HDL Ratio: 3
Triglycerides: 95 mg/dL (ref 0.0–149.0)
VLDL: 19 mg/dL (ref 0.0–40.0)

## 2015-11-12 LAB — BASIC METABOLIC PANEL
BUN: 10 mg/dL (ref 6–23)
CALCIUM: 9.5 mg/dL (ref 8.4–10.5)
CO2: 31 mEq/L (ref 19–32)
CREATININE: 1.04 mg/dL (ref 0.40–1.50)
Chloride: 104 mEq/L (ref 96–112)
GFR: 76.78 mL/min (ref >60.00)
GLUCOSE: 80 mg/dL (ref 70–99)
Potassium: 3.7 mEq/L (ref 3.5–5.1)
Sodium: 142 mEq/L (ref 135–145)

## 2015-11-12 NOTE — Progress Notes (Signed)
Subjective:    Patient ID: Alexis Chan, male    DOB: [birth year], 65 y.o.   MRN: [id number]  DOS:  11/12/2015 Type of visit - description :  CPX Interval history: no major concerns, feeling well.    Review of Systems Constitutional: No fever. No chills. No unexplained wt changes. No unusual sweats  HEENT: No dental problems, no ear discharge, no facial swelling, no voice changes. No eye discharge, no eye  redness , no  intolerance to light   Respiratory: No wheezing , no  difficulty breathing. No cough , no mucus production  Cardiovascular: No CP, no leg swelling , no  Palpitations  GI: no nausea, no vomiting, no diarrhea , no  abdominal pain.  History of hemorrhoids, occasional fresh appearing blood after a bowel movement, very rarely.No dysphagia, no odynophagia    Endocrine: No polyphagia, no polyuria , no polydipsia  GU: No dysuria, gross hematuria, difficulty urinating. No urinary urgency, no frequency.  Musculoskeletal: No joint swellings or unusual aches or pains  Skin: No change in the color of the skin, palor , no  Rash  Allergic, immunologic: No environmental allergies , no  food allergies  Neurological: No dizziness no  syncope. No headaches. No diplopia, no slurred, no slurred speech, no motor deficits, no facial  Numbness  Hematological: No enlarged lymph nodes, no easy bruising , no unusual bleedings  Psychiatry: No suicidal ideas, no hallucinations, no beavior problems, no confusion.  No unusual/severe anxiety, no depression   Past Medical History  Diagnosis Date  . Seasonal allergies   . OSA (obstructive sleep apnea) 01/30/2013    Past Surgical History  Procedure Laterality Date  . No past surgeries      Social History   Social History  . Marital Status: Married    Spouse Name: N/A  . Number of Children: 0  . Years of Education: N/A   Occupational History  . [employer]    Social History Main Topics  . Smoking status: Never  Smoker   . Smokeless tobacco: Never Used  . Alcohol Use: No  . Drug Use: No  . Sexual Activity: Not on file   Other Topics Concern  . Not on file   Social History Narrative   Household-- pt and wife   Mother is 27, still living independently     Family History  Problem Relation Age of Onset  . Diabetes Neg Hx   . CAD Neg Hx   . Colon cancer Neg Hx   . Prostate cancer Neg Hx   . Stroke Neg Hx       Medication List       This list is accurate as of: 11/12/15 11:59 PM.  Always use your most recent med list.               fluticasone 50 MCG/ACT nasal spray  Commonly known as:  FLONASE  PT NEEDS OFFICE VISIT.  PLACE 1 SPRAY INTO THE NOSE 2 TIMES DAILY AS NEEDED FOR RHINITIS.     HM MULTIVITAMIN ADULT GUMMY PO  Take by mouth daily.           Objective:   Physical Exam BP 118/66 mmHg  Pulse 57  Temp(Src) 98.1 F (36.7 C) (Oral)  Ht 5\' 8"  (1.727 m)  Wt 177 lb 2 oz (80.343 kg)  BMI 26.94 kg/m2  SpO2 98% General:   Well developed, well nourished . NAD.  HEENT:  Normocephalic . Face symmetric, atraumatic  Neck: No thyromegaly, normal carotid pulses Lungs:  CTA B Normal respiratory effort, no intercostal retractions, no accessory muscle use. Heart: RRR,  no murmur.  no pretibial edema bilaterally  Abdomen:  Not distended, soft, non-tender. No rebound or rigidity. No mass,organomegaly Skin: Not pale. Not jaundice Neurologic:  alert & oriented X3.  Speech normal, gait appropriate for age and unassisted Psych--  Cognition and judgment appear intact.  Cooperative with normal attention span and concentration.  Behavior appropriate. No anxious or depressed appearing.    Assessment & Plan:   Assessment OSA H/o hemorrhoids  Plan: OSA: Good compliance with CPAP. Hemorrhoids-- no red flag sx  RTC one year

## 2015-11-12 NOTE — Assessment & Plan Note (Signed)
Td 2014 Had a colonoscopy before, due to repeat 6- 2017, patient aware, waiting for GI letter Prostate cancer screening, DRE  PSA  wnl 2016 Diet and exercise discussed   Return to clinic 1 year

## 2015-11-12 NOTE — Progress Notes (Signed)
Pre visit review using our clinic review tool, if applicable. No additional management support is needed unless otherwise documented below in the visit note. 

## 2015-11-12 NOTE — Patient Instructions (Signed)
GO TO THE LAB : Get the blood work    GO TO THE FRONT DESK  Schedule a complete physical exam to be done in 1 year Please be fasting

## 2015-11-13 DIAGNOSIS — Z09 Encounter for follow-up examination after completed treatment for conditions other than malignant neoplasm: Secondary | ICD-10-CM | POA: Insufficient documentation

## 2015-11-13 NOTE — Assessment & Plan Note (Signed)
OSA: Good compliance with CPAP. Hemorrhoids-- no red flag sx  RTC one year

## 2016-01-15 ENCOUNTER — Encounter: Payer: Self-pay | Admitting: Gastroenterology

## 2016-02-10 ENCOUNTER — Telehealth: Payer: Self-pay | Admitting: Internal Medicine

## 2016-02-10 DIAGNOSIS — Z1211 Encounter for screening for malignant neoplasm of colon: Secondary | ICD-10-CM

## 2016-02-10 NOTE — Telephone Encounter (Signed)
Pt called in to request a referral for a colonoscopy. He said that he has had one before a few years ago. Pt says that it is just routine.

## 2016-02-10 NOTE — Telephone Encounter (Signed)
Referral placed.

## 2016-02-12 ENCOUNTER — Encounter: Payer: Self-pay | Admitting: Gastroenterology

## 2016-03-11 ENCOUNTER — Encounter: Payer: Self-pay | Admitting: Gastroenterology

## 2016-03-11 ENCOUNTER — Ambulatory Visit (AMBULATORY_SURGERY_CENTER): Payer: Self-pay

## 2016-03-11 VITALS — Ht 68.0 in | Wt 177.0 lb

## 2016-03-11 DIAGNOSIS — Z1211 Encounter for screening for malignant neoplasm of colon: Secondary | ICD-10-CM

## 2016-03-11 MED ORDER — NA SULFATE-K SULFATE-MG SULF 17.5-3.13-1.6 GM/177ML PO SOLN: 1.0000 | Freq: Once | ORAL | Status: DC

## 2016-03-11 NOTE — Progress Notes (Signed)
No egg or soy allergy.  No previous complications from anesthesia. No home O2. No diet meds. 

## 2016-03-25 ENCOUNTER — Ambulatory Visit (AMBULATORY_SURGERY_CENTER): Payer: BC Managed Care – PPO | Admitting: Gastroenterology

## 2016-03-25 ENCOUNTER — Encounter: Payer: Self-pay | Admitting: Gastroenterology

## 2016-03-25 VITALS — BP 113/70 | HR 68 | Temp 98.0°F | Resp 14 | Ht 68.0 in | Wt 177.0 lb

## 2016-03-25 DIAGNOSIS — D120 Benign neoplasm of cecum: Secondary | ICD-10-CM | POA: Diagnosis not present

## 2016-03-25 DIAGNOSIS — D122 Benign neoplasm of ascending colon: Secondary | ICD-10-CM

## 2016-03-25 DIAGNOSIS — D125 Benign neoplasm of sigmoid colon: Secondary | ICD-10-CM

## 2016-03-25 DIAGNOSIS — Z1211 Encounter for screening for malignant neoplasm of colon: Secondary | ICD-10-CM | POA: Diagnosis not present

## 2016-03-25 DIAGNOSIS — D123 Benign neoplasm of transverse colon: Secondary | ICD-10-CM

## 2016-03-25 MED ORDER — SODIUM CHLORIDE 0.9 % IV SOLN: 500.0000 mL | INTRAVENOUS | Status: DC

## 2016-03-25 NOTE — Patient Instructions (Signed)
YOU HAD AN ENDOSCOPIC PROCEDURE TODAY AT [facility]:   Refer to the procedure report that was given to you for any specific questions about what was found during the examination.  If the procedure report does not answer your questions, please call your gastroenterologist to clarify.  If you requested that your care partner not be given the details of your procedure findings, then the procedure report has been included in a sealed envelope for you to review at your convenience later.  YOU SHOULD EXPECT: Some feelings of bloating in the abdomen. Passage of more gas than usual.  Walking can help get rid of the air that was put into your GI tract during the procedure and reduce the bloating. If you had a lower endoscopy (such as a colonoscopy or flexible sigmoidoscopy) you may notice spotting of blood in your stool or on the toilet paper. If you underwent a bowel prep for your procedure, you may not have a normal bowel movement for a few days.  Please Note:  You might notice some irritation and congestion in your nose or some drainage.  This is from the oxygen used during your procedure.  There is no need for concern and it should clear up in a day or so.  SYMPTOMS TO REPORT IMMEDIATELY:   Following lower endoscopy (colonoscopy or flexible sigmoidoscopy):  Excessive amounts of blood in the stool  Significant tenderness or worsening of abdominal pains  Swelling of the abdomen that is new, acute  Fever of 100F or higher  For urgent or emergent issues, a gastroenterologist can be reached at any hour by calling [phone].  DIET: Your first meal following the procedure should be a small meal and then it is ok to progress to your normal diet. Heavy or fried foods are harder to digest and may make you feel nauseous or bloated.  Likewise, meals heavy in dairy and vegetables can increase bloating.  Drink plenty of fluids but you should avoid alcoholic beverages for 24 hours.  ACTIVITY:   You should plan to take it easy for the rest of today and you should NOT DRIVE or use heavy machinery until tomorrow (because of the sedation medicines used during the test).    FOLLOW UP: Our staff will call the number listed on your records the next business day following your procedure to check on you and address any questions or concerns that you may have regarding the information given to you following your procedure. If we do not reach you, we will leave a message.  However, if you are feeling well and you are not experiencing any problems, there is no need to return our call.  We will assume that you have returned to your regular daily activities without incident.  If any biopsies were taken you will be contacted by phone or by letter within the next 1-3 weeks.  Please call us at [phone] if you have not heard about the biopsies in 3 weeks.    SIGNATURES/CONFIDENTIALITY: You and/or your care partner have signed paperwork which will be entered into your electronic medical record.  These signatures attest to the fact that that the information above on your After Visit Summary has been reviewed and is understood.  Full responsibility of the confidentiality of this discharge information lies with you and/or your care-partner.  NO ASPIRIN, ASPIRIN CONTAINING PRODUCTS (BC OR GOODY POWDERS), OR NSAIDS (IBUPROFEN, ADVIL, ALEVE, MOTRIN) FOR 2 WEEKS; TYLENOL IS OK TO TAKE  Please read  over handouts about polyps, hemorrhoids and high fiber diets  Await pathology

## 2016-03-25 NOTE — Progress Notes (Signed)
Report to PACU, RN, vss, BBS= Clear.  

## 2016-03-25 NOTE — Progress Notes (Signed)
Called to room to assist during endoscopic procedure.  Patient ID and intended procedure confirmed with present staff. Received instructions for my participation in the procedure from the performing physician.  

## 2016-03-25 NOTE — Op Note (Signed)
[facility] Patient Name: Alexis Chan Procedure Date: 03/25/2016 10:04 AM MRN: [id number] Endoscopist: Remo Lipps P. Havery Moros , MD Age: 65 Referring MD:  Date of Birth: 12-Nov-1950 Gender: Male Account #: [id number] Procedure:                Colonoscopy Indications:              Screening for malignant neoplasm in the colon Medicines:                Monitored Anesthesia Care Procedure:                Pre-Anesthesia Assessment:                           - Prior to the procedure, a History and Physical                            was performed, and patient medications and                            allergies were reviewed. The patient's tolerance of                            previous anesthesia was also reviewed. The risks                            and benefits of the procedure and the sedation                            options and risks were discussed with the patient.                            All questions were answered, and informed consent                            was obtained. Prior Anticoagulants: The patient has                            taken no previous anticoagulant or antiplatelet                            agents. ASA Grade Assessment: II - A patient with                            mild systemic disease. After reviewing the risks                            and benefits, the patient was deemed in                            satisfactory condition to undergo the procedure.                           After obtaining informed consent, the colonoscope  was passed under direct vision. Throughout the                            procedure, the patient's blood pressure, pulse, and                            oxygen saturations were monitored continuously. The                            Model CF-HQ190L [id number]) scope was introduced                            through the anus and advanced to the the cecum,                            identified by  appendiceal orifice and ileocecal                            valve. The colonoscopy was performed without                            difficulty. The patient tolerated the procedure                            well. The quality of the bowel preparation was                            good. The ileocecal valve, appendiceal orifice, and                            rectum were photographed. Scope In: 10:16:32 AM Scope Out: 10:36:16 AM Scope Withdrawal Time: 0 hours 17 minutes 3 seconds  Total Procedure Duration: 0 hours 19 minutes 44 seconds  Findings:                 The perianal and digital rectal examinations were                            normal.                           A 7 mm polyp was found in the cecum. The polyp was                            sessile. The polyp was removed with a cold snare.                            Resection and retrieval were complete.                           A diminutive polyp was found in the cecum. The                            polyp was sessile. The polyp was removed with a  cold biopsy forceps. Resection and retrieval were                            complete.                           Two sessile polyps were found in the ascending                            colon. The polyps were 4 to 5 mm in size. These                            polyps were removed with a cold snare. Resection                            and retrieval were complete.                           A 4 mm polyp was found in the hepatic flexure. The                            polyp was sessile. The polyp was removed with a                            cold snare. Resection and retrieval were complete.                           A 5 mm polyp was found in the transverse colon. The                            polyp was sessile. The polyp was removed with a                            cold snare. Resection and retrieval were complete.                           A 4 mm polyp was  found in the sigmoid colon. The                            polyp was sessile. The polyp was removed with a                            cold snare. Resection and retrieval were complete.                           Non-bleeding internal hemorrhoids were found during                            retroflexion.                           The exam was otherwise without abnormality. Complications:            No  immediate complications. Estimated blood loss:                            Minimal. Estimated Blood Loss:     Estimated blood loss was minimal. Impression:               - One 7 mm polyp in the cecum, removed with a cold                            snare. Resected and retrieved.                           - One diminutive polyp in the cecum, removed with a                            cold biopsy forceps. Resected and retrieved.                           - Two 4 to 5 mm polyps in the ascending colon,                            removed with a cold snare. Resected and retrieved.                           - One 4 mm polyp at the hepatic flexure, removed                            with a cold snare. Resected and retrieved.                           - One 5 mm polyp in the transverse colon, removed                            with a cold snare. Resected and retrieved.                           - One 4 mm polyp in the sigmoid colon, removed with                            a cold snare. Resected and retrieved.                           - Non-bleeding internal hemorrhoids.                           - The examination was otherwise normal. Recommendation:           - Patient has a contact number available for                            emergencies. The signs and symptoms of potential                            delayed complications were discussed  with the                            patient. Return to normal activities tomorrow.                            Written discharge instructions were provided to the                             patient.                           - Resume previous diet.                           - Continue present medications.                           - No aspirin, ibuprofen, naproxen, or other                            non-steroidal anti-inflammatory drugs for 2 weeks                            after polyp removal.                           - Await pathology results.                           - Repeat colonoscopy is recommended for                            surveillance. The colonoscopy date will be                            determined after pathology results from today's                            exam become available for review. Remo Lipps P. Armbruster, MD 03/25/2016 10:42:02 AM This report has been signed electronically.

## 2016-03-26 ENCOUNTER — Telehealth: Payer: Self-pay

## 2016-03-26 NOTE — Telephone Encounter (Signed)
  Follow up Call-  Call back number 03/25/2016  Post procedure Call Back phone  # #[phone] cell  Permission to leave phone message Yes     Patient questions:  Do you have a fever, pain , or abdominal swelling? No. Pain Score  0 *  Have you tolerated food without any problems? Yes.    Have you been able to return to your normal activities? Yes.    Do you have any questions about your discharge instructions: Diet   No. Medications  No. Follow up visit  No.  Do you have questions or concerns about your Care? No.  Actions: * If pain score is 4 or above: No action needed, pain <4.

## 2016-03-31 ENCOUNTER — Encounter: Payer: Self-pay | Admitting: Gastroenterology

## 2016-11-12 ENCOUNTER — Encounter: Payer: BC Managed Care – PPO | Admitting: Internal Medicine

## 2016-11-29 ENCOUNTER — Ambulatory Visit (INDEPENDENT_AMBULATORY_CARE_PROVIDER_SITE_OTHER): Payer: BC Managed Care – PPO | Admitting: Internal Medicine

## 2016-11-29 ENCOUNTER — Encounter: Payer: Self-pay | Admitting: Internal Medicine

## 2016-11-29 VITALS — BP 108/62 | HR 58 | Temp 98.2°F | Resp 14 | Ht 68.0 in | Wt 178.0 lb

## 2016-11-29 DIAGNOSIS — Z1159 Encounter for screening for other viral diseases: Secondary | ICD-10-CM

## 2016-11-29 DIAGNOSIS — Z0000 Encounter for general adult medical examination without abnormal findings: Secondary | ICD-10-CM | POA: Diagnosis not present

## 2016-11-29 DIAGNOSIS — Z114 Encounter for screening for human immunodeficiency virus [HIV]: Secondary | ICD-10-CM

## 2016-11-29 DIAGNOSIS — Z09 Encounter for follow-up examination after completed treatment for conditions other than malignant neoplasm: Secondary | ICD-10-CM

## 2016-11-29 LAB — BASIC METABOLIC PANEL
BUN: 17 mg/dL (ref 6–23)
CHLORIDE: 103 meq/L (ref 96–112)
CO2: 31 meq/L (ref 19–32)
Calcium: 9.7 mg/dL (ref 8.4–10.5)
Creatinine, Ser: 1.02 mg/dL (ref 0.40–1.50)
GFR: 78.25 mL/min (ref >60.00)
Glucose, Bld: 94 mg/dL (ref 70–99)
Potassium: 4.1 mEq/L (ref 3.5–5.1)
SODIUM: 140 meq/L (ref 135–145)

## 2016-11-29 LAB — LIPID PANEL
Cholesterol: 158 mg/dL (ref 0–200)
HDL: 38.5 mg/dL — AB (ref >39.00)
LDL CALC: 99 mg/dL (ref 0–99)
NonHDL: 119.28
Total CHOL/HDL Ratio: 4
Triglycerides: 102 mg/dL (ref 0.0–149.0)
VLDL: 20.4 mg/dL (ref 0.0–40.0)

## 2016-11-29 LAB — HEPATITIS C ANTIBODY: HCV Ab: NEGATIVE

## 2016-11-29 NOTE — Assessment & Plan Note (Signed)
Td 2014; does not take flu shots  Last colonoscopy 03-2016, + polyps, next 3 years Prostate cancer screening, DRE wnl today, check a PSA Diet and exercise discussed  Labs: Hep C, HIV, BMP, FLP, TSH, PSA Return to clinic 1 year

## 2016-11-29 NOTE — Progress Notes (Signed)
Subjective:    Patient ID: Alexis Chan, male    DOB: [birth year], 66 y.o.   MRN: [id number]  DOS:  11/29/2016 Type of visit - description :  CPX Interval history: No concerns, retired now from the state and works independently. He remains active at work.  Review of Systems  Needing dental work. H/O hemorrhoids, minimal symptoms occasionally, no severe bleeding or severe symptoms. Other than above, a 14 point review of systems is negative     Past Medical History:  Diagnosis Date  . Allergy   . OSA (obstructive sleep apnea) 01/30/2013  . Seasonal allergies   . Sleep apnea    wares a c-pap    Past Surgical History:  Procedure Laterality Date  . COLONOSCOPY  2007  . WISDOM TOOTH EXTRACTION      Social History   Social History  . Marital status: Married    Spouse name: N/A  . Number of children: 0  . Years of education: N/A   Occupational History  . retired from the [employer]), now has a Psychologist, prison and probation services    Social History Main Topics  . Smoking status: Never Smoker  . Smokeless tobacco: Never Used  . Alcohol use No  . Drug use: No  . Sexual activity: Not on file   Other Topics Concern  . Not on file   Social History Narrative   Household-- pt and wife   Mother is 58,  still living independently     Family History  Problem Relation Age of Onset  . Colon cancer Paternal Aunt     ? dx in her 92's  . CAD Neg Hx   . Prostate cancer Neg Hx   . Stroke Neg Hx   . Esophageal cancer Neg Hx   . Pancreatic cancer Neg Hx   . Rectal cancer Neg Hx   . Stomach cancer Neg Hx     Allergies as of 11/29/2016   No Known Allergies     Medication List       Accurate as of 11/29/16 12:05 PM. Always use your most recent med list.          fluticasone 50 MCG/ACT nasal spray Commonly known as:  FLONASE PT NEEDS OFFICE VISIT.  PLACE 1 SPRAY INTO THE NOSE 2 TIMES DAILY AS NEEDED FOR RHINITIS.   HM MULTIVITAMIN ADULT GUMMY PO Take by mouth  daily.   OCUVITE EXTRA PO Take 2 tablets by mouth daily.   OSTEO BI-FLEX JOINT SHIELD PO Take by mouth.   VITAMIN B-12 CR PO Take 3,000 mg by mouth daily.          Objective:   Physical Exam BP 108/62 (BP Location: Left Arm, Patient Position: Sitting, Cuff Size: Normal)   Pulse (!) 58   Temp 98.2 F (36.8 C) (Oral)   Resp 14   Ht 5\' 8"  (1.727 m)   Wt 178 lb (80.7 kg)   SpO2 98%   BMI 27.06 kg/m  General:   Well developed, well nourished . NAD.  Neck: No  thyromegaly  HEENT:  Normocephalic . Face symmetric, atraumatic Lungs:  CTA B Normal respiratory effort, no intercostal retractions, no accessory muscle use. Heart: RRR,  no murmur.  No pretibial edema bilaterally  Abdomen:  Not distended, soft, non-tender. No rebound or rigidity.   Rectal:  External abnormalities: none. Normal sphincter tone. No rectal masses or tenderness.  Stool brown  Prostate: Prostate gland firm and smooth, no  enlargement, nodularity, tenderness, mass, asymmetry or induration.  Skin: Exposed areas without rash. Not pale. Not jaundice Neurologic:  alert & oriented X3.  Speech normal, gait appropriate for age and unassisted Strength symmetric and appropriate for age.  Psych: Cognition and judgment appear intact.  Cooperative with normal attention span and concentration.  Behavior appropriate. No anxious or depressed appearing.     Assessment & Plan:   Assessment OSA H/o hemorrhoids  Plan: OSA: Good compliance with CPAP. Hemorrhoids-- minimal sx  RTC one year

## 2016-11-29 NOTE — Progress Notes (Signed)
Pre visit review using our clinic review tool, if applicable. No additional management support is needed unless otherwise documented below in the visit note. 

## 2016-11-29 NOTE — Patient Instructions (Signed)
GO TO THE LAB : Get the blood work     GO TO THE FRONT DESK Schedule your next appointment for a physical exam in one year, fasting

## 2016-11-29 NOTE — Assessment & Plan Note (Signed)
OSA: Good compliance with CPAP. Hemorrhoids-- minimal sx  RTC one year

## 2016-11-30 LAB — HIV ANTIBODY (ROUTINE TESTING W REFLEX): HIV: NONREACTIVE

## 2016-11-30 LAB — PSA: PSA: 0.7 ng/mL (ref 0.10–4.00)

## 2016-11-30 LAB — TSH: TSH: 2.64 u[IU]/mL (ref 0.35–4.50)

## 2017-11-25 ENCOUNTER — Encounter: Payer: Self-pay | Admitting: Family Medicine

## 2017-11-25 ENCOUNTER — Telehealth: Payer: Self-pay | Admitting: Internal Medicine

## 2017-11-25 ENCOUNTER — Ambulatory Visit: Payer: BC Managed Care – PPO | Admitting: Family Medicine

## 2017-11-25 VITALS — BP 112/68 | HR 85 | Temp 99.7°F | Ht 68.0 in | Wt 187.4 lb

## 2017-11-25 DIAGNOSIS — J0100 Acute maxillary sinusitis, unspecified: Secondary | ICD-10-CM | POA: Diagnosis not present

## 2017-11-25 MED ORDER — AMOXICILLIN-POT CLAVULANATE 875-125 MG PO TABS: 1.0000 | ORAL_TABLET | Freq: Two times a day (BID) | ORAL | 0 refills | Status: DC

## 2017-11-25 NOTE — Progress Notes (Signed)
Pre visit review using our clinic review tool, if applicable. No additional management support is needed unless otherwise documented below in the visit note. 

## 2017-11-25 NOTE — Telephone Encounter (Signed)
Med list updated

## 2017-11-25 NOTE — Telephone Encounter (Signed)
Copied from [place] [phone]. Topic: Quick Communication - See Telephone Encounter >> Nov 25, 2017  3:26 PM Rosalin Hawking wrote: CRM for notification. See Telephone encounter for:  11/25/17.   Pt was seen today and forgot to mentioned to provider that pt was advise by his optometrist to stop taking Ocuvite Extra, pt would like to inform his provider so it can be updated on his chart. Pt is no longer on Ocuvite extra.

## 2017-11-25 NOTE — Patient Instructions (Addendum)
Continue to push fluids, practice good hand hygiene, and cover your mouth if you cough.  If you start having fevers, shaking or shortness of breath, seek immediate care.  Ok to use Aleve for facial pain.  Keep using Flonase.  Let us know if you need anything.

## 2017-11-25 NOTE — Progress Notes (Signed)
Chief Complaint  Patient presents with  . Fever    sinuse drainage  . Cough    facial pressure    Tillman Abide here for URI complaints.  Duration: 3 days  Associated symptoms: low grade fevers, sinus congestion, sinus pain, rhinorrhea and cough Denies: itchy watery eyes, ear pain, ear drainage, sore throat, wheezing, shortness of breath and myalgia Treatment to date: Zyrtec, Flonase Sick contacts: Yes- mom and wife  ROS:  Const: + fevers HEENT: As noted in HPI Lungs: No SOB  Past Medical History:  Diagnosis Date  . Allergy   . OSA (obstructive sleep apnea) 01/30/2013  . Seasonal allergies   . Sleep apnea    wares a c-pap   BP 112/68 (BP Location: Left Arm, Patient Position: Sitting, Cuff Size: Normal)   Pulse 85   Temp 99.7 F (37.6 C) (Oral)   Ht 5\' 8"  (1.727 m)   Wt 187 lb 6 oz (85 kg)   SpO2 97%   BMI 28.49 kg/m  General: Awake, alert, appears stated age HEENT: AT, [place], ears patent b/l and TM's neg, nares patent w/o discharge, pharynx pink and without exudates, MMM Neck: No masses or asymmetry Heart: RRR Lungs: CTAB, no accessory muscle use Psych: Age appropriate judgment and insight, normal mood and affect  Acute maxillary sinusitis, recurrence not specified - Plan: amoxicillin-clavulanate (AUGMENTIN) 875-125 MG tablet  Orders as above. NSAIDs, supportive care. Tx given fevers.  Continue to push fluids, practice good hand hygiene, cover mouth when coughing. F/u prn. If starting to experience increasing fevers, shaking, or shortness of breath, seek immediate care. Pt voiced understanding and agreement to the plan.  [place], DO 11/25/17 4:48 PM

## 2018-08-23 ENCOUNTER — Ambulatory Visit (INDEPENDENT_AMBULATORY_CARE_PROVIDER_SITE_OTHER)
Admission: RE | Admit: 2018-08-23 | Discharge: 2018-08-23 | Disposition: A | Discharge status: Home / Self Care | Payer: Medicare Other | Source: Ambulatory Visit | Attending: Pulmonary Disease | Admitting: Pulmonary Disease

## 2018-08-23 ENCOUNTER — Encounter: Payer: Self-pay | Admitting: Pulmonary Disease

## 2018-08-23 ENCOUNTER — Ambulatory Visit: Payer: Self-pay | Admitting: *Deleted

## 2018-08-23 ENCOUNTER — Ambulatory Visit (INDEPENDENT_AMBULATORY_CARE_PROVIDER_SITE_OTHER): Payer: Medicare Other | Admitting: Pulmonary Disease

## 2018-08-23 VITALS — BP 118/70 | HR 74 | Ht 62.0 in | Wt 187.0 lb

## 2018-08-23 DIAGNOSIS — G4733 Obstructive sleep apnea (adult) (pediatric): Secondary | ICD-10-CM | POA: Diagnosis not present

## 2018-08-23 DIAGNOSIS — Z9989 Dependence on other enabling machines and devices: Secondary | ICD-10-CM

## 2018-08-23 DIAGNOSIS — R091 Pleurisy: Secondary | ICD-10-CM | POA: Diagnosis not present

## 2018-08-23 NOTE — Patient Instructions (Signed)
Will arrange for [facility] to get you new CPAP supplies  Chest xray today  Follow up in 1 year

## 2018-08-23 NOTE — Telephone Encounter (Signed)
Pt reports mid to lower back pain, onset 3 days ago. Pain intermittent with "Deep breaths only." Rates at  6-7/10  when occurs. States radiates to "Kidney area lower back."  Denies pain with movement, denies SOB, cough, CP, dizziness. Denies dysuria but states "Burning sensation where kidneys are located, not with urination." Does not recall any recent injury, strenuous activity. Has not taken anything for pain.  No availability at practice today; declines another practice, declines seeing PA.  Pt directed to UC. States will follow disposition.  Reason for Disposition . [1] Pain or burning with passing urine (urination) AND [2] flank pain (i.e., in side, below ribs and above hip)    No dysuria, pain with "Deep breaths and goes to kidney area."  Answer Assessment - Initial Assessment Questions 1. ONSET: "When did the pain begin?"      3 days ago 2. LOCATION: "Where does it hurt?" (upper, mid or lower back)    Mid back, across entire back 3. SEVERITY: "How bad is the pain?"  (e.g., Scale 1-10; mild, moderate, or severe)   - MILD (1-3): doesn't interfere with normal activities    - MODERATE (4-7): interferes with normal activities or awakens from sleep    - SEVERE (8-10): excruciating pain, unable to do any normal activities      6/10 with deep breaths, laughing 4. PATTERN: "Is the pain constant?" (e.g., yes, no; constant, intermittent)     Intermittent 5. RADIATION: "Does the pain shoot into your legs or elsewhere?"     no 6. CAUSE:  "What do you think is causing the back pain?"      Unsure 7. BACK OVERUSE:  "Any recent lifting of heavy objects, strenuous work or exercise?"     Sitting a lot 8. MEDICATIONS: "What have you taken so far for the pain?" (e.g., nothing, acetaminophen, NSAIDS)     nothing 9. NEUROLOGIC SYMPTOMS: "Do you have any weakness, numbness, or problems with bowel/bladder control?"     no 10. OTHER SYMPTOMS: "Do you have any other symptoms?" (e.g., fever, abdominal pain,  burning with urination, blood in urine)      no  Protocols used: BACK PAIN-A-AH

## 2018-08-23 NOTE — Progress Notes (Signed)
[facility]  Chief Complaint  Patient presents with  . Follow-up    Pt is doing well overall with cpap machine. When pt takes a deep breath he has a sharp pain on both sides in upper back.    Constitutional:  BP 118/70 (BP Location: Left Arm, Cuff Size: Normal)   Pulse 74   Ht 5\' 2"  (1.575 m)   Wt 187 lb (84.8 kg)   SpO2 94%   BMI 34.20 kg/m   Past Medical History:  Allergies  Brief Summary:  Alexis Chan is a 67 y.o. male with obstructive sleep apnea.  He has been doing well with CPAP.  He was buying his supplies on his own.  This was less expensive with his previous insurance compared to going through a DME.  He has since changed to medicare.  He was helping his mother while she was in hospital a few days ago.  While in the hospital, he developed pain across his upper back.  He says this hurts when he takes a deep breath.  He isn't having cough, wheeze, fever, sputum, or hemoptysis.  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  Physical Exam:   Appearance - well kempt   ENMT - clear nasal mucosa, midline nasal  septum, no oral exudates, no LAN, trachea midline  Respiratory - normal chest wall, normal respiratory effort, no accessory muscle use, no wheeze/rales  CV - s1s2 regular rate and rhythm, no murmurs, no peripheral edema, radial pulses symmetric  GI - soft, non tender, no masses  Lymph - no adenopathy noted in neck and axillary areas  MSK - normal gait  Ext - no cyanosis, clubbing, or joint inflammation noted  Skin - no rashes, lesions, or ulcers  Neuro - normal strength, oriented x 3  Psych - normal mood and affect   Assessment/Plan:   Obstructive sleep apnea. - he is compliant with CPAP and reports benefit from therapy  - reviewed his download with him - explained he should be eligible for a new machine; he would like to keep his current machine - will try to get him set up with [facility]  to get CPAP supplies  Pleurisy. - exam is unremarkable - will get chest xray - if CXR is negative, then he can use OTC NSAIDs prn   Patient Instructions  Will arrange for [facility] to get you new CPAP supplies  Chest xray today  Follow up in 1 year  Time spent 27 minutes  Chesley Mires, MD [place] Pager: [phone] 08/23/2018, 2:21 PM  Flow Sheet    Sleep tests:  PSG 02/19/13 >> AHI 33.3, SpO2 low 78%, PLMI 20. REM AHI 60, Supine AHI 50. Auto CPAP 05/25/18 to 08/22/18 >> used on 90 of 90 nights with average 8 hrs 10 min. Average AHI 3 with median CPAP 10 and 95 th percentile CPAP 13 cm H2O  Medications:   Allergies as of 08/23/2018   No Known Allergies     Medication List        Accurate as of 08/23/18  2:21 PM. Always use your most recent med list.          fluticasone 50 MCG/ACT nasal spray Commonly known as:  FLONASE PT NEEDS OFFICE VISIT.  PLACE 1 SPRAY INTO THE NOSE 2 TIMES DAILY AS NEEDED FOR RHINITIS.   HM MULTIVITAMIN ADULT GUMMY PO Take by mouth daily.   OSTEO BI-FLEX  JOINT SHIELD PO Take by mouth.   VITAMIN B-12 CR PO Take 3,000 mg by mouth daily.       Past Surgical History:  He  has a past surgical history that includes Colonoscopy (2007) and Wisdom tooth extraction.  Family History:  His family history includes Colon cancer in his paternal aunt.  Social History:  He  reports that he has never smoked. He has never used smokeless tobacco. He reports that he does not drink alcohol or use drugs.

## 2018-08-25 ENCOUNTER — Telehealth: Payer: Self-pay | Admitting: Pulmonary Disease

## 2018-08-25 NOTE — Telephone Encounter (Signed)
Dg Chest 2 View  Result Date: 08/24/2018 CLINICAL DATA:  Bilateral lower chest pain for 3 days. EXAM: CHEST - 2 VIEW COMPARISON:  01/19/2008 FINDINGS: Cardiomediastinal silhouette is normal. Mediastinal contours appear intact. There is no evidence of focal airspace consolidation, pleural effusion or pneumothorax. Osseous structures are without acute abnormality. 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 Soft tissues are grossly normal. IMPRESSION: No active cardiopulmonary disease. Electronically Signed   By: Fidela Salisbury M.D.   On: 08/24/2018 08:50    Please let him know his chest xray was normal.  He can try alleve as needed to help with his back pain.

## 2018-08-25 NOTE — Telephone Encounter (Signed)
Called and spoke with patient regarding results.  Informed the patient of results and recommendations today. Pt verbalized understanding and denied any questions or concerns at this time.  Nothing further needed.  

## 2019-04-12 ENCOUNTER — Encounter: Payer: Self-pay | Admitting: Gastroenterology

## 2019-04-25 ENCOUNTER — Encounter: Payer: Self-pay | Admitting: Internal Medicine

## 2019-09-30 IMAGING — DX DG CHEST 2V
2 series · 2 of 2 positions shown · non-contrast
Comparison: 01/19/2008

CLINICAL DATA: Bilateral lower chest pain for 3 days.

EXAM:
CHEST - 2 VIEW

[chest pa]
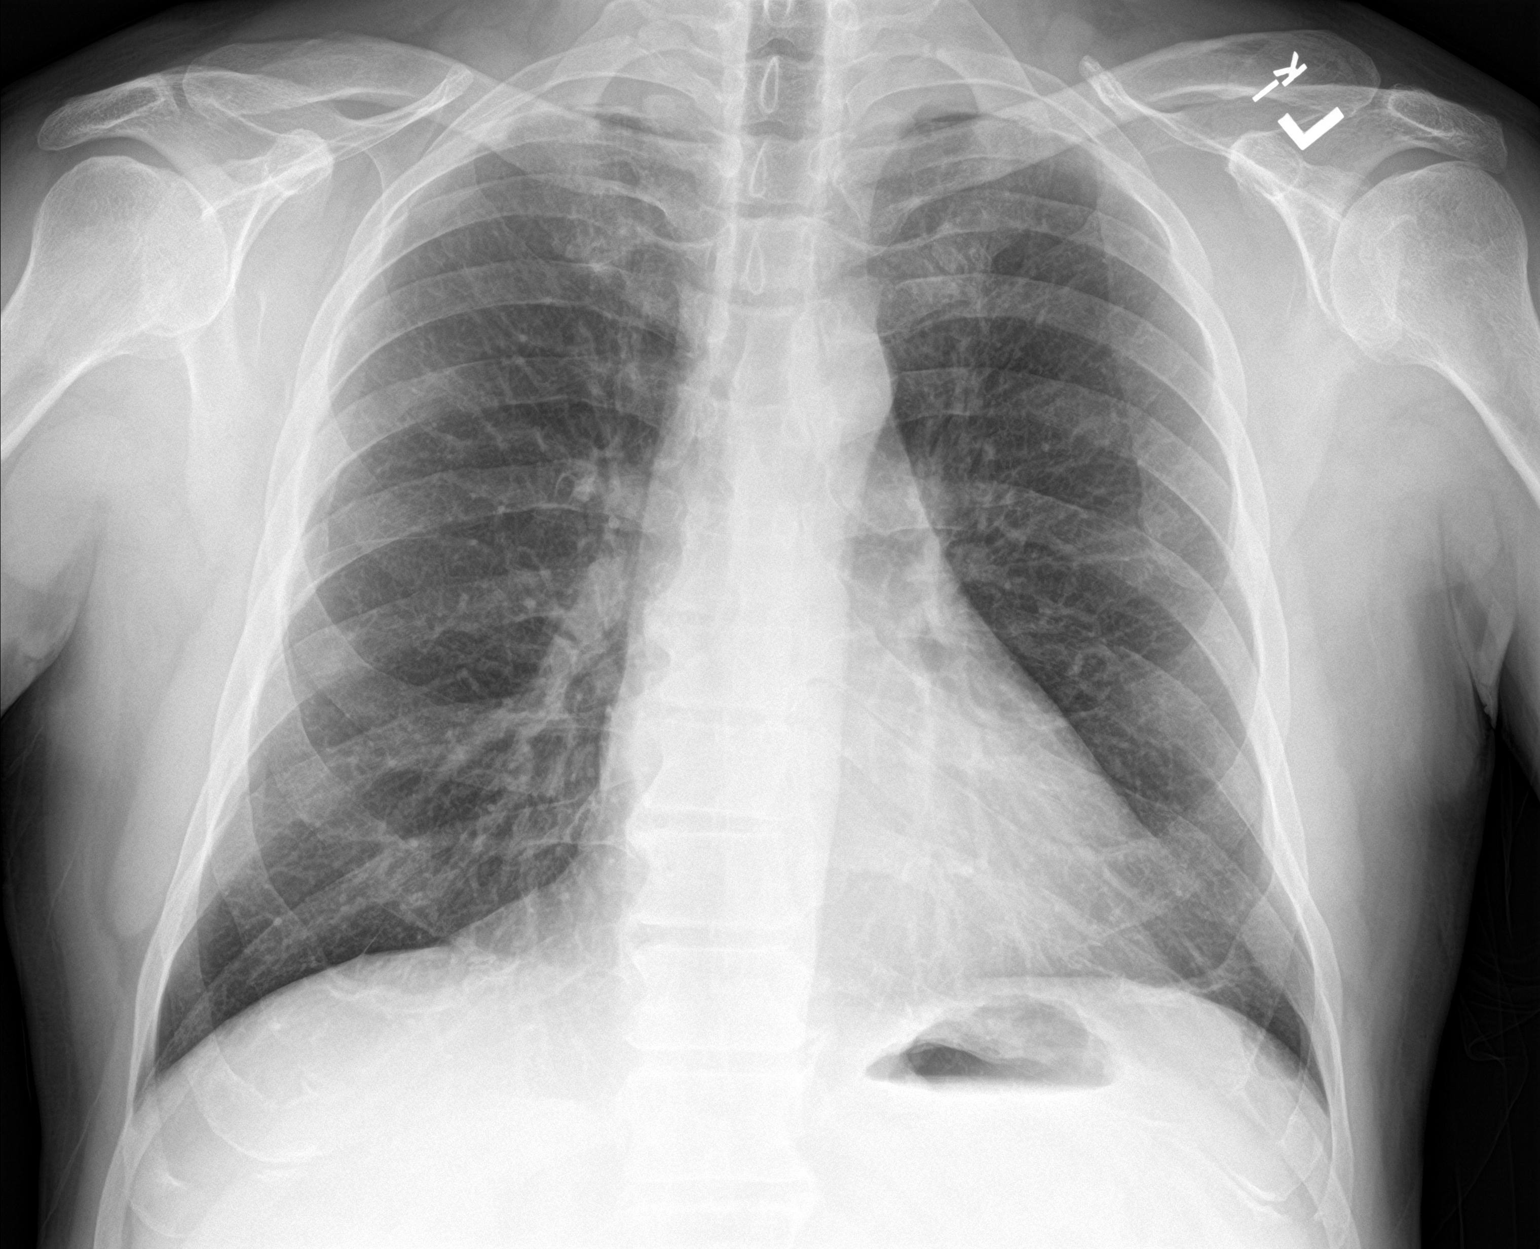

[chest lat]
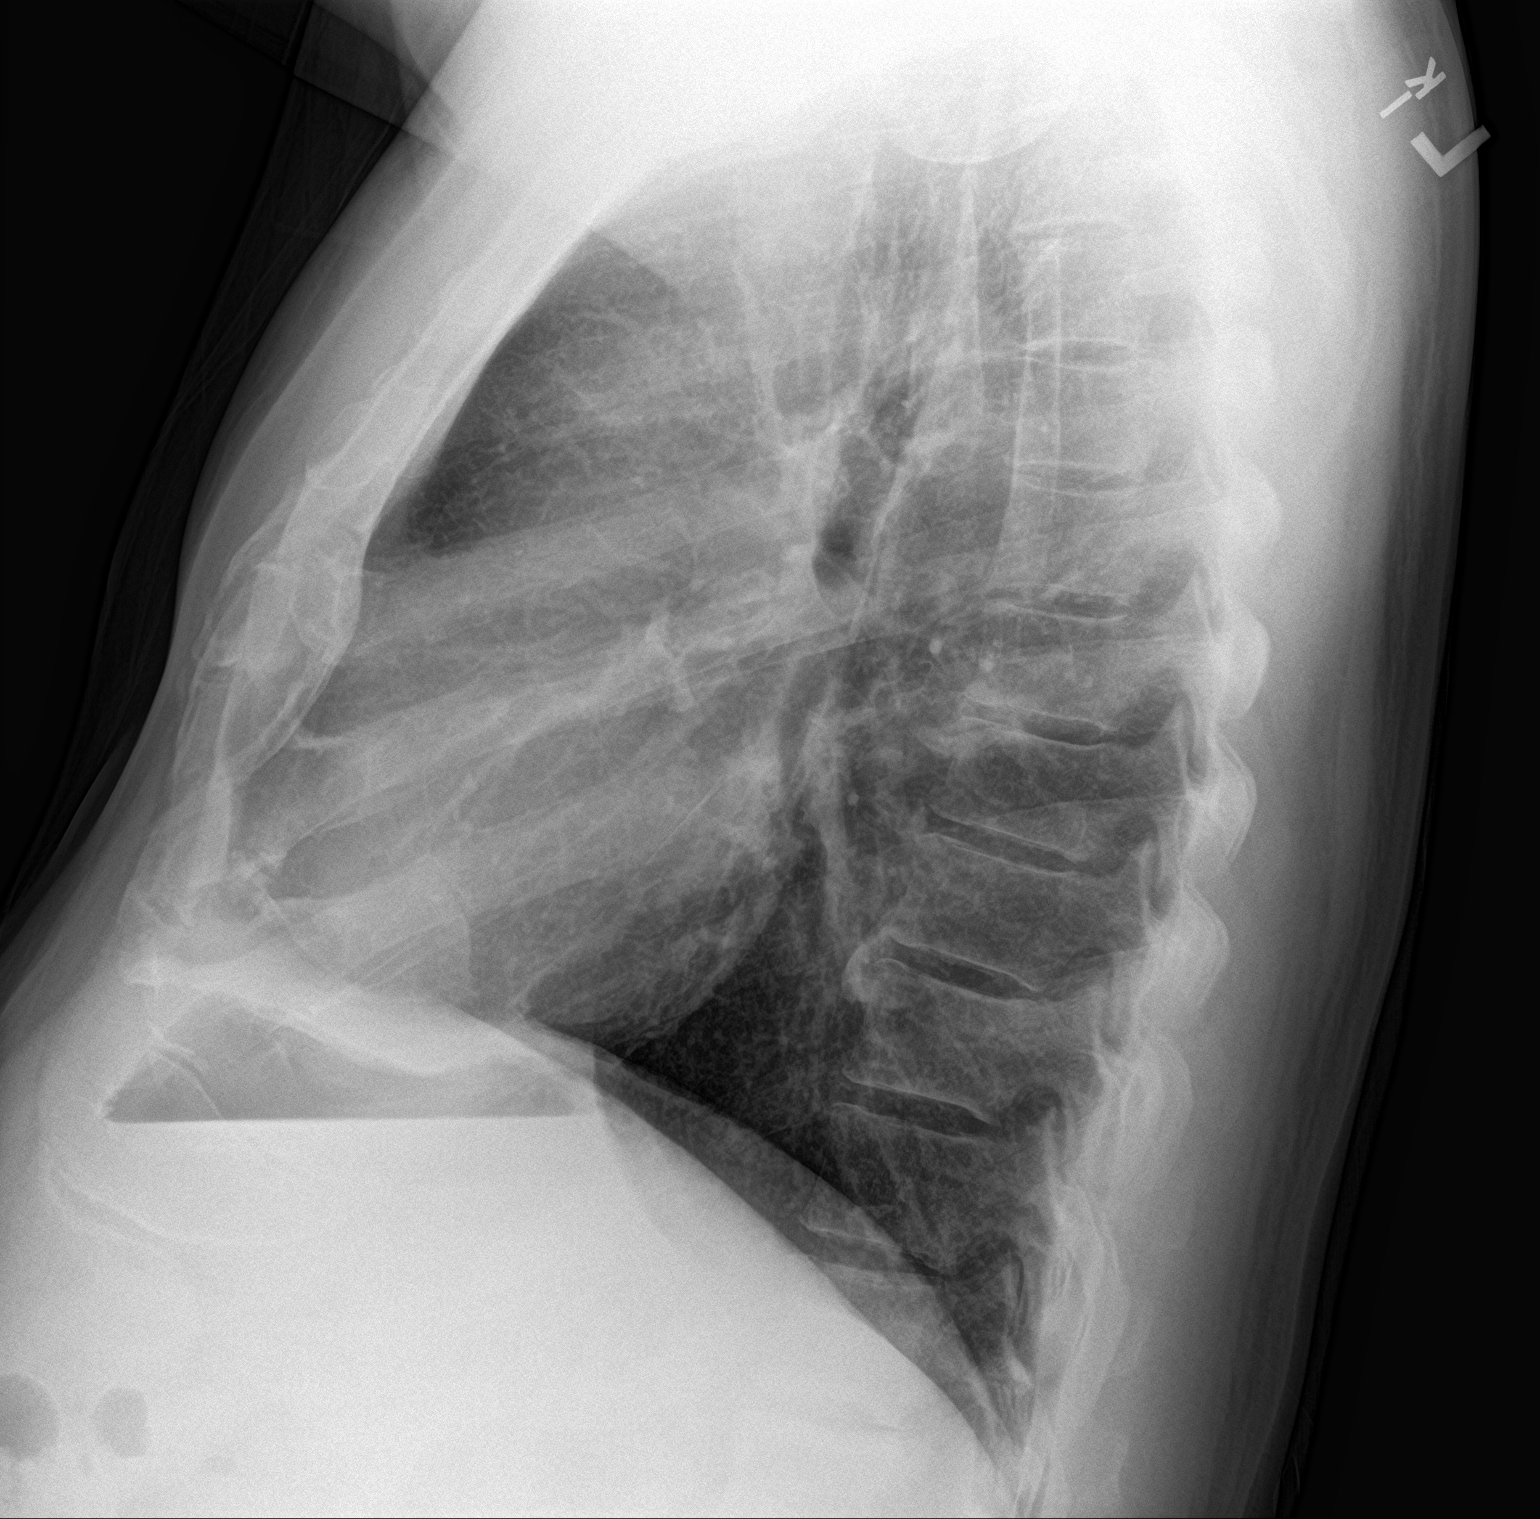

[2 of 2 positions shown; findings below may reference images not displayed]

FINDINGS: Cardiomediastinal silhouette is normal. Mediastinal contours appear
intact.

There is no evidence of focal airspace consolidation, pleural
effusion or pneumothorax.

Osseous structures are without acute abnormality. Stigmata of
diffuse idiopathic skeletal hyperostosis of the thoracic spine. Soft
tissues are grossly normal.
IMPRESSION: No active cardiopulmonary disease.

## 2020-07-29 ENCOUNTER — Other Ambulatory Visit: Payer: Self-pay

## 2020-07-29 ENCOUNTER — Ambulatory Visit: Payer: Medicare PPO | Admitting: Dermatology

## 2020-07-29 DIAGNOSIS — Z1283 Encounter for screening for malignant neoplasm of skin: Secondary | ICD-10-CM | POA: Diagnosis not present

## 2020-07-29 DIAGNOSIS — L570 Actinic keratosis: Secondary | ICD-10-CM

## 2020-07-29 DIAGNOSIS — D489 Neoplasm of uncertain behavior, unspecified: Secondary | ICD-10-CM

## 2020-07-29 DIAGNOSIS — D229 Melanocytic nevi, unspecified: Secondary | ICD-10-CM

## 2020-07-29 DIAGNOSIS — Z86018 Personal history of other benign neoplasm: Secondary | ICD-10-CM

## 2020-07-29 DIAGNOSIS — L814 Other melanin hyperpigmentation: Secondary | ICD-10-CM

## 2020-07-29 DIAGNOSIS — D2272 Melanocytic nevi of left lower limb, including hip: Secondary | ICD-10-CM

## 2020-07-29 DIAGNOSIS — D225 Melanocytic nevi of trunk: Secondary | ICD-10-CM | POA: Diagnosis not present

## 2020-07-29 DIAGNOSIS — L578 Other skin changes due to chronic exposure to nonionizing radiation: Secondary | ICD-10-CM

## 2020-07-29 DIAGNOSIS — L820 Inflamed seborrheic keratosis: Secondary | ICD-10-CM

## 2020-07-29 DIAGNOSIS — D485 Neoplasm of uncertain behavior of skin: Secondary | ICD-10-CM

## 2020-07-29 DIAGNOSIS — L821 Other seborrheic keratosis: Secondary | ICD-10-CM

## 2020-07-29 DIAGNOSIS — L905 Scar conditions and fibrosis of skin: Secondary | ICD-10-CM | POA: Diagnosis not present

## 2020-07-29 DIAGNOSIS — D1800 Hemangioma unspecified site: Secondary | ICD-10-CM

## 2020-07-29 DIAGNOSIS — L719 Rosacea, unspecified: Secondary | ICD-10-CM | POA: Diagnosis not present

## 2020-07-29 DIAGNOSIS — Z85828 Personal history of other malignant neoplasm of skin: Secondary | ICD-10-CM | POA: Diagnosis not present

## 2020-07-29 HISTORY — DX: Personal history of other benign neoplasm: Z86.018

## 2020-07-29 MED ORDER — METRONIDAZOLE 0.75 % EX GEL: CUTANEOUS | 2 refills | Status: DC

## 2020-07-29 NOTE — Progress Notes (Signed)
New Patient Visit  Subjective  Alexis Chan is a 69 y.o. male who presents for the following: TBSE/Skin Cancer Screening (New patient here today for TBSE. He does have a history of BCC . We will request records from previous dermatologist. ).  There is a spot by his left ear that itches sometimes as well as an itchy spot at  back where he had BCC removed. He gets red bumps on nose that come and go.  The following portions of the chart were reviewed this encounter and updated as appropriate:      Review of Systems:  No other skin or systemic complaints except as noted in HPI or Assessment and Plan.  Objective  Well appearing patient in no apparent distress; mood and affect are within normal limits.  A full examination was performed including scalp, head, eyes, ears, nose, lips, neck, chest, axillae, abdomen, back, buttocks, bilateral upper extremities, bilateral lower extremities, hands, feet, fingers, toes, fingernails, and toenails. All findings within normal limits unless otherwise noted below.  Objective  Spinal Mid Back, left upper back: Thickened white smooth plaques  Objective  Left Spinal Upper Back: 68mm brown macule with irregular pigmentation       Objective  Spinal Mid Back: 81mm medium dark brown macule       Objective  left temple x 5, right temple x 7 (12): Erythematous thin papules/macules with gritty scale.   Objective  Nose: Erythema with telangiectasias  Objective  Left Upper antecubitum, feet: 3 x 105mm medium brown macule with notch at left upper antecubitum Brown macules feet  Images      Objective  left mandible, left lower cheek (2): Erythematous keratotic or waxy stuck-on papule   Assessment & Plan  Scar Spinal Mid Back, left upper back  Previous BCC sites with hypertrophic scar, clear, itchy Discussed ILK, pt defers Gave samples of Halog ointment  apply bid prn itch  Neoplasm of uncertain behavior of skin Left Spinal  Upper Back  Epidermal / dermal shaving  Lesion diameter (cm):  0.3 Informed consent: discussed and consent obtained   Patient was prepped and draped in usual sterile fashion: Area prepped with alcohol. Anesthesia: the lesion was anesthetized in a standard fashion   Anesthetic:  1% lidocaine w/ epinephrine 1-100,000 buffered w/ 8.4% NaHCO3 Instrument used: flexible razor blade   Hemostasis achieved with: pressure, aluminum chloride and electrodesiccation   Outcome: patient tolerated procedure well   Post-procedure details: wound care instructions given   Post-procedure details comment:  Ointment and small bandage applied.  Additional details:  Post tx defect 0.7cm   Specimen 2 - Surgical pathology Differential Diagnosis: Nevus r/o Dysplasia Check Margins: No 9mm brown macule with irregular pigmentation    Neoplasm of uncertain behavior Spinal Mid Back  Epidermal / dermal shaving  Lesion diameter (cm):  0.3 Informed consent: discussed and consent obtained   Patient was prepped and draped in usual sterile fashion: Area prepped with alcohol. Anesthesia: the lesion was anesthetized in a standard fashion   Anesthetic:  1% lidocaine w/ epinephrine 1-100,000 buffered w/ 8.4% NaHCO3 Instrument used: flexible razor blade   Hemostasis achieved with: pressure, aluminum chloride and electrodesiccation   Outcome: patient tolerated procedure well   Post-procedure details: wound care instructions given   Post-procedure details comment:  Ointment and small bandage applied.  Additional details:  Post tx defect 0.6cm  Specimen 1 - Surgical pathology Differential Diagnosis: Nevus r/o Dysplasia Check Margins: No 39mm medium dark brown macule  AK (actinic keratosis) (12) left temple x 5, right temple x 7  Destruction of lesion - left temple x 5, right temple x 7 Complexity: simple   Destruction method: cryotherapy   Informed consent: discussed and consent obtained   Lesion  destroyed using liquid nitrogen: Yes   Region frozen until ice ball extended beyond lesion: Yes   Outcome: patient tolerated procedure well with no complications   Post-procedure details: wound care instructions given    Rosacea Nose  Start metronidazole 0.75% gel to nose 1-2 times daily   Ordered Medications: metroNIDAZOLE (METROGEL) 0.75 % gel  Nevus Left Upper antecubitum, feet  Benign-appearing.  Observation.  Call clinic for new or changing lesions.  Recommend daily use of broad spectrum spf 30+ sunscreen to sun-exposed areas.    Inflamed seborrheic keratosis (2) left mandible, left lower cheek  Destruction of lesion - left mandible, left lower cheek  Destruction method: cryotherapy   Informed consent: discussed and consent obtained   Lesion destroyed using liquid nitrogen: Yes   Region frozen until ice ball extended beyond lesion: Yes   Outcome: patient tolerated procedure well with no complications   Post-procedure details: wound care instructions given     Lentigines - Scattered tan macules - Discussed due to sun exposure - Benign, observe - Call for any changes  Seborrheic Keratoses - Stuck-on, waxy, tan-brown papules and plaques  - Discussed benign etiology and prognosis. - Observe - Call for any changes  Melanocytic Nevi - Tan-brown and/or pink-flesh-colored symmetric macules and papules - Benign appearing on exam today - Observation - Call clinic for new or changing moles - Recommend daily use of broad spectrum spf 30+ sunscreen to sun-exposed areas.   Hemangiomas - Red papules - Discussed benign nature - Observe - Call for any changes  Actinic Damage - Severe, chronic, secondary to cumulative UV radiation exposure over time - diffuse scaly erythematous macules and papules with underlying dyspigmentation - Discussed "Field Treatment" for Severe, Confluent Actinic Changes with Pre-Cancerous Actinic Keratoses Field treatment involves treatment of  an entire area of skin that has confluent Actinic Changes (Sun/ Ultraviolet light damage) and PreCancerous Actinic Keratoses by method of PhotoDynamic Therapy (PDT) and/or prescription Topical Chemotherapy agents such as 5-fluorouracil, 5-fluorouracil/calcipotriene, and/or imiquimod.  The purpose is to decrease the number of clinically evident and subclinical PreCancerous lesions to prevent progression to development of skin cancer by chemically destroying early precancer changes that may or may not be visible.  It has been shown to reduce the risk of developing skin cancer in the treated area. As a result of treatment, redness, scaling, crusting, and open sores may occur during treatment course. One or more than one of these methods may be used and may have to be used several times to control, suppress and eliminate the PreCancerous changes. Discussed treatment course, expected reaction, and possible side effects. - Recommend daily broad spectrum sunscreen SPF 30+ to sun-exposed areas, reapply every 2 hours as needed.  - Call for new or changing lesions. - pt will schedule for PDT face in January  Skin cancer screening performed today.  History of Basal Cell Carcinoma of the Skin - No evidence of recurrence today - Recommend regular full body skin exams - Recommend daily broad spectrum sunscreen SPF 30+ to sun-exposed areas, reapply every 2 hours as needed.  - Call if any new or changing lesions are noted between office visits  Graciella Belton, RMA, am acting as scribe for Brendolyn Patty, MD .  Return in  about 6 months (around 01/26/2021) for AK follow up, PDT x 2 to face 1 month apart jan/feb 2022.  Graciella Belton, RMA, am acting as scribe for Brendolyn Patty, MD . Documentation: I have reviewed the above documentation for accuracy and completeness, and I agree with the above.  Brendolyn Patty MD

## 2020-07-29 NOTE — Patient Instructions (Addendum)
Melanoma ABCDEs  Melanoma is the most dangerous type of skin cancer, and is the leading cause of death from skin disease.  You are more likely to develop melanoma if you:  Have light-colored skin, light-colored eyes, or red or blond hair  Spend a lot of time in the sun  Tan regularly, either outdoors or in a tanning bed  Have had blistering sunburns, especially during childhood  Have a close family member who has had a melanoma  Have atypical moles or large birthmarks  Early detection of melanoma is key since treatment is typically straightforward and cure rates are extremely high if we catch it early.   The first sign of melanoma is often a change in a mole or a new dark spot.  The ABCDE system is a way of remembering the signs of melanoma.  A for asymmetry:  The two halves do not match. B for border:  The edges of the growth are irregular. C for color:  A mixture of colors are present instead of an even brown color. D for diameter:  Melanomas are usually (but not always) greater than 59mm - the size of a pencil eraser. E for evolution:  The spot keeps changing in size, shape, and color.  Please check your skin once per month between visits. You can use a small mirror in front and a large mirror behind you to keep an eye on the back side or your body.   If you see any new or changing lesions before your next follow-up, please call to schedule a visit.  Please continue daily skin protection including broad spectrum sunscreen SPF 30+ to sun-exposed areas, reapplying every 2 hours as needed when you're outdoors.   Wound Care Instructions  1. Cleanse wound gently with soap and water once a day then pat dry with clean gauze. Apply a thing coat of Petrolatum (petroleum jelly, "Vaseline") over the wound (unless you have an allergy to this). We recommend that you use a new, sterile tube of Vaseline. Do not pick or remove scabs. Do not remove the yellow or white "healing tissue" from the  base of the wound.  2. Cover the wound with fresh, clean, nonstick gauze and secure with paper tape. You may use Band-Aids in place of gauze and tape if the would is small enough, but would recommend trimming much of the tape off as there is often too much. Sometimes Band-Aids can irritate the skin.  3. You should call the office for your biopsy report after 1 week if you have not already been contacted.  4. If you experience any problems, such as abnormal amounts of bleeding, swelling, significant bruising, significant pain, or evidence of infection, please call the office immediately.  5. FOR ADULT SURGERY PATIENTS: If you need something for pain relief you may take 1 extra strength Tylenol (acetaminophen) AND 2 Ibuprofen (200mg  each) together every 4 hours as needed for pain. (do not take these if you are allergic to them or if you have a reason you should not take them.) Typically, you may only need pain medication for 1 to 3 days.   Cryotherapy Aftercare  . Wash gently with soap and water everyday.   Marland Kitchen Apply Vaseline and Band-Aid daily until healed.

## 2020-08-04 ENCOUNTER — Telehealth: Payer: Self-pay

## 2020-08-04 NOTE — Telephone Encounter (Signed)
-----   Message from Brendolyn Patty, MD sent at 08/04/2020  9:25 AM EST ----- 1. 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 Skin , left spinal upper back DYSPLASTIC JUNCTIONAL LENTIGINOUS NEVUS WITH MODERATE ATYPIA, LIMITED MARGINS FREE, AND SOLAR LENTIGO  1 and 2. Moderate atypical moles, removed at biopsy, observe

## 2020-08-04 NOTE — Telephone Encounter (Signed)
Lft pt msg to call for bx results/sh °

## 2020-08-04 NOTE — Telephone Encounter (Signed)
Patient advised of biopsy results.

## 2020-10-01 ENCOUNTER — Ambulatory Visit: Payer: Medicare PPO

## 2020-10-01 ENCOUNTER — Other Ambulatory Visit: Payer: Self-pay

## 2020-10-01 DIAGNOSIS — L570 Actinic keratosis: Secondary | ICD-10-CM | POA: Diagnosis not present

## 2020-10-01 MED ORDER — AMINOLEVULINIC ACID HCL 20 % EX SOLR
1.0000 "application " | Freq: Once | CUTANEOUS | Status: AC
  Administered 2020-10-01: 354 mg via TOPICAL

## 2020-10-01 NOTE — Progress Notes (Signed)

## 2020-10-01 NOTE — Patient Instructions (Signed)

## 2020-11-05 ENCOUNTER — Other Ambulatory Visit: Payer: Self-pay

## 2020-11-05 ENCOUNTER — Ambulatory Visit (INDEPENDENT_AMBULATORY_CARE_PROVIDER_SITE_OTHER): Payer: Medicare PPO

## 2020-11-05 DIAGNOSIS — L570 Actinic keratosis: Secondary | ICD-10-CM

## 2020-11-05 MED ORDER — AMINOLEVULINIC ACID HCL 20 % EX SOLR
1.0000 "application " | Freq: Once | CUTANEOUS | Status: AC
  Administered 2020-11-05: 354 mg via TOPICAL

## 2020-11-05 NOTE — Progress Notes (Signed)
Patient completed PDT therapy today.  1. AK (actinic keratosis) Head - Anterior (Face)  Photodynamic therapy - Head - Anterior (Face) Procedure discussed: discussed risks, benefits, side effects. and alternatives   Prep: site scrubbed/prepped with acetone   Location:  Face Number of lesions:  Multiple Type of treatment:  Blue light Aminolevulinic Acid (see MAR for details): Levulan Number of Levulan sticks used:  1 Incubation time (minutes):  60 Number of minutes under lamp:  16 Number of seconds under lamp:  40 Cooling:  Floor fan Outcome: patient tolerated procedure well with no complications   Post-procedure details: sunscreen applied     

## 2020-11-05 NOTE — Patient Instructions (Signed)

## 2021-02-03 ENCOUNTER — Encounter: Payer: Self-pay | Admitting: Dermatology

## 2021-02-03 ENCOUNTER — Other Ambulatory Visit: Payer: Self-pay

## 2021-02-03 ENCOUNTER — Ambulatory Visit: Payer: Medicare PPO | Admitting: Dermatology

## 2021-02-03 DIAGNOSIS — L905 Scar conditions and fibrosis of skin: Secondary | ICD-10-CM

## 2021-02-03 DIAGNOSIS — L719 Rosacea, unspecified: Secondary | ICD-10-CM

## 2021-02-03 DIAGNOSIS — L578 Other skin changes due to chronic exposure to nonionizing radiation: Secondary | ICD-10-CM

## 2021-02-03 DIAGNOSIS — Z872 Personal history of diseases of the skin and subcutaneous tissue: Secondary | ICD-10-CM

## 2021-02-03 DIAGNOSIS — L820 Inflamed seborrheic keratosis: Secondary | ICD-10-CM

## 2021-02-03 DIAGNOSIS — Z86018 Personal history of other benign neoplasm: Secondary | ICD-10-CM | POA: Diagnosis not present

## 2021-02-03 DIAGNOSIS — L821 Other seborrheic keratosis: Secondary | ICD-10-CM

## 2021-02-03 NOTE — Progress Notes (Signed)
Follow-Up Visit   Subjective  Alexis Chan is a 70 y.o. male who presents for the following: Actinic Keratosis (Face S/P PDT x 2,1 mth apart - check for new or persistent skin lesions) and dysplastic nevi (Bx proven - moderately dysplastic nevi on the spinal mid back L spinal upper back ). He has some spots on the face/scalp that get irritated.  He had a good result from the PDT treatment.  The following portions of the chart were reviewed this encounter and updated as appropriate:      Review of Systems:  No other skin or systemic complaints except as noted in HPI or Assessment and Plan.  Objective  Well appearing patient in no apparent distress; mood and affect are within normal limits.  A focused examination was performed including the face and back . Relevant physical exam findings are noted in the Assessment and Plan.  Objective  Nose: Mid face erythema with telangiectasias   Objective  Left Upper Back: Hypertrophic scar   Objective  L inf mandible x 3, L sideburn x 1, L temporal hairline x 1, R temporal hairline x 1 (6): Erythematous keratotic or waxy stuck-on papule  Objective  Face: Clear.  Assessment & Plan  Rosacea Nose  Controlled Rosacea is a chronic progressive skin condition usually affecting the face of adults, causing redness and/or acne bumps. It is treatable but not curable. It sometimes affects the eyes (ocular rosacea) as well. It may respond to topical and/or systemic medication and can flare with stress, sun exposure, alcohol, exercise and some foods.  Daily application of broad spectrum spf 30+ sunscreen to face is recommended to reduce flares.  Continue Metronidazole 0.75% gel BID.   Other Related Medications metroNIDAZOLE (METROGEL) 0.75 % gel  Scar conditions and fibrosis of skin Left Upper Back  Benign, observe.    Inflamed seborrheic keratosis (6) L inf mandible x 3, L sideburn x 1, L temporal hairline x 1, R temporal hairline x  1  Prior to procedure, discussed risks of blister formation, small wound, skin dyspigmentation, or rare scar following cryotherapy.    Destruction of lesion - L inf mandible x 3, L sideburn x 1, L temporal hairline x 1, R temporal hairline x 1  Destruction method: cryotherapy   Informed consent: discussed and consent obtained   Lesion destroyed using liquid nitrogen: Yes   Region frozen until ice ball extended beyond lesion: Yes   Outcome: patient tolerated procedure well with no complications   Post-procedure details: wound care instructions given    History of actinic keratosis Face  Clear. Observe for recurrence. Call clinic for new or changing lesions.  Recommend regular skin exams, daily broad-spectrum spf 30+ sunscreen use, and photoprotection.    Good result post PDT x 2 treatments   History of Dysplastic Nevi - spinal mid back, L spinal upper back  - No evidence of recurrence today - Recommend regular full body skin exams - Recommend daily broad spectrum sunscreen SPF 30+ to sun-exposed areas, reapply every 2 hours as needed.  - Call if any new or changing lesions are noted between office visits  Actinic Damage - chronic, secondary to cumulative UV radiation exposure/sun exposure over time - erythematous macules with underlying dyspigmentation - Recommend daily broad spectrum sunscreen SPF 30+ to sun-exposed areas, reapply every 2 hours as needed.  - Recommend staying in the shade or wearing long sleeves, sun glasses (UVA+UVB protection) and wide brim hats (4-inch brim around the entire circumference of  the hat). - Call for new or changing lesions.  Seborrheic Keratoses - Stuck-on, waxy, tan-brown papules and/or plaques  - Benign-appearing - Discussed benign etiology and prognosis. - Observe - Call for any changes  Return in about 6 months (around 08/06/2021) for AK follow up and dysplastic nevi recheck.  Alexis Chan, CMA, am acting as scribe for Brendolyn Patty, MD  .  Documentation: I have reviewed the above documentation for accuracy and completeness, and I agree with the above.  Brendolyn Patty MD

## 2021-02-03 NOTE — Patient Instructions (Signed)

## 2021-06-29 ENCOUNTER — Telehealth: Payer: Self-pay

## 2021-06-29 NOTE — Telephone Encounter (Signed)
-----   Message from Colon Branch, MD sent at 06/23/2021  8:55 AM EDT ----- Regarding: RE: Pt requesting to re-establish care Unable to   ----- Message ----- From: Trixie Rude Sent: 06/22/2021   5:30 PM EDT To: Colon Branch, MD Subject: Pt requesting to re-establish care             Dr. Larose Kells,  Patient called wanting to be seen for acute care.  Patient last saw you for a CPE on 11/29/16 and Dr. Nani Ravens on 12/05/17 for an acute visit.  I advised pt I would have to ask your permission to re-establish care.  Pt stated head congestion, ear infection and a cyst that burst in ear.  He also stated he has not seen his pulmonologist since 2019 and needs a c-pap replacement.  He stated he is also due a colonoscopy.  Please advise if you are willing to take patient back on for primary care and if you are willing to see patient for acute needs prior to setting him up for re-establish of care appointment.  Thank you,  Anderson Malta

## 2021-06-29 NOTE — Telephone Encounter (Signed)
Pt was advised Dr. Larose Kells is unable to re-establish care with him on 06/24/21.  I provided pt with names of PA and NP at this practice that are currently accepting new pts and he said he would get back with me.

## 2021-08-11 ENCOUNTER — Ambulatory Visit: Payer: Medicare PPO | Admitting: Dermatology

## 2021-08-11 ENCOUNTER — Encounter: Payer: Self-pay | Admitting: Dermatology

## 2021-08-11 ENCOUNTER — Other Ambulatory Visit: Payer: Self-pay

## 2021-08-11 DIAGNOSIS — L578 Other skin changes due to chronic exposure to nonionizing radiation: Secondary | ICD-10-CM

## 2021-08-11 DIAGNOSIS — L739 Follicular disorder, unspecified: Secondary | ICD-10-CM

## 2021-08-11 DIAGNOSIS — Z872 Personal history of diseases of the skin and subcutaneous tissue: Secondary | ICD-10-CM

## 2021-08-11 DIAGNOSIS — D225 Melanocytic nevi of trunk: Secondary | ICD-10-CM | POA: Diagnosis not present

## 2021-08-11 DIAGNOSIS — Z86018 Personal history of other benign neoplasm: Secondary | ICD-10-CM

## 2021-08-11 DIAGNOSIS — L820 Inflamed seborrheic keratosis: Secondary | ICD-10-CM

## 2021-08-11 DIAGNOSIS — L910 Hypertrophic scar: Secondary | ICD-10-CM | POA: Diagnosis not present

## 2021-08-11 DIAGNOSIS — L719 Rosacea, unspecified: Secondary | ICD-10-CM

## 2021-08-11 DIAGNOSIS — L821 Other seborrheic keratosis: Secondary | ICD-10-CM

## 2021-08-11 DIAGNOSIS — L814 Other melanin hyperpigmentation: Secondary | ICD-10-CM

## 2021-08-11 DIAGNOSIS — D229 Melanocytic nevi, unspecified: Secondary | ICD-10-CM

## 2021-08-11 DIAGNOSIS — D1800 Hemangioma unspecified site: Secondary | ICD-10-CM

## 2021-08-11 NOTE — Progress Notes (Signed)
Follow-Up Visit   Subjective  Alexis Chan is a 70 y.o. male who presents for the following: Follow-up (Patient here today for 6 month follow up; he reports a spot at scalp and back. Patient has history of dysplastic nevus. He also reports a spot at back of left neck near hairline. ).  Spot on scalp gets caught on comb and gets irritated.  He uses metrogel for rosacea on his nose which helps.    The following portions of the chart were reviewed this encounter and updated as appropriate:      Review of Systems: No other skin or systemic complaints except as noted in HPI or Assessment and Plan.   Objective  Well appearing patient in no apparent distress; mood and affect are within normal limits.  A focused examination was performed including upper extremities, including the arms, hands, fingers, and fingernails. Relevant physical exam findings are noted in the Assessment and Plan.  nose,cheeks Erythema with telangectasia at cheeks and nose   Left Occipital Scalp Follicular pink papule at L occipital hailine  left spinal mid upper back 1.5 cm firm subcutaneous nodule within scar   Right Frontal Scalp x 1 Erythematous keratotic or waxy stuck-on papule   central abdomen 3 mm speckled dark brown macule   Right Lower Back 6 mm brown macule with darker edge   Assessment & Plan  Rosacea nose,cheeks  Improves when uses topical  Rosacea is a chronic progressive skin condition usually affecting the face of adults, causing redness and/or acne bumps. It is treatable but not curable. It sometimes affects the eyes (ocular rosacea) as well. It may respond to topical and/or systemic medication and can flare with stress, sun exposure, alcohol, exercise and some foods.  Daily application of broad spectrum spf 30+ sunscreen to face is recommended to reduce flares.  Continue metronidazole gel qd/bid as prescribed   Related Medications metroNIDAZOLE (METROGEL) 0.75 % gel Apply to  affected areas nose 1-2 times daily.  Folliculitis Left Occipital Scalp  Benign, observe.   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 Wallene Chan with [facility] dermatology     Inflamed seborrheic keratosis Right Frontal Scalp x 1  Destruction of lesion - Right Frontal Scalp x 1  Destruction method: cryotherapy   Informed consent: discussed and consent obtained   Lesion destroyed using liquid nitrogen: Yes   Region frozen until ice ball extended beyond lesion: Yes   Outcome: patient tolerated procedure well with no complications   Post-procedure details: wound care instructions given   Additional details:  Prior to procedure, discussed risks of blister formation, small wound, skin dyspigmentation, or rare scar following cryotherapy. Recommend Vaseline ointment to treated areas while healing.   Nevus (2) central abdomen; Right Lower Back  Benign-appearing.  Observation.  Call clinic for new or changing lesions.  Recommend daily use of broad spectrum spf 30+ sunscreen to sun-exposed areas.   Seborrheic Keratoses - Stuck-on, waxy, tan-brown papules and/or plaques  - Benign-appearing - Discussed benign etiology and prognosis. - Observe - Call for any changes  Hemangiomas - Red papules - Discussed benign nature - Observe - Call for any changes  Lentigines - Scattered tan macules - Due to sun exposure - Benign-appering, observe - Recommend daily broad spectrum sunscreen SPF 30+ to sun-exposed areas, reapply  every 2 hours as needed. - Call for any changes  Melanocytic Nevi - Tan-brown and/or pink-flesh-colored symmetric macules and papules - Benign appearing on exam today - Observation - Call clinic for new or changing moles - Recommend daily use of broad spectrum spf 30+  sunscreen to sun-exposed areas.   Actinic Damage - chronic, secondary to cumulative UV radiation exposure/sun exposure over time - diffuse scaly erythematous macules with underlying dyspigmentation - Recommend daily broad spectrum sunscreen SPF 30+ to sun-exposed areas, reapply every 2 hours as needed.  - Recommend staying in the shade or wearing long sleeves, sun glasses (UVA+UVB protection) and wide brim hats (4-inch brim around the entire circumference of the hat). - Call for new or changing lesions.  History of Dysplastic Nevi - No evidence of recurrence today - Recommend regular full body skin exams - Recommend daily broad spectrum sunscreen SPF 30+ to sun-exposed areas, reapply every 2 hours as needed.  - Call if any new or changing lesions are noted between office visits  History of PreCancerous Actinic Keratosis  - site(s) of PreCancerous Actinic Keratosis clear today.  Pt has had PDT to face in past with good result. - these may recur and new lesions may form requiring treatment to prevent transformation into skin cancer - observe for new or changing spots and contact [name] for appointment if occur - photoprotection with sun protective clothing; sunglasses and broad spectrum sunscreen with SPF of at least 30 + and frequent self skin exams recommended - yearly exams by a dermatologist recommended for persons with history of PreCancerous Actinic Keratoses   Return for 6 month follow up h/o ak, dysplastic nevi. I, Alexis Chan, CMA, am acting as scribe for Alexis Patty, MD.  Documentation: I have reviewed the above documentation for accuracy and completeness, and I agree with the above.  Alexis Patty MD

## 2021-08-11 NOTE — Patient Instructions (Signed)
Seborrheic Keratosis  What causes seborrheic keratoses? Seborrheic keratoses are harmless, common skin growths that first appear during adult life.  As time goes by, more growths appear.  Some people may develop a large number of them.  Seborrheic keratoses appear on both covered and uncovered body parts.  They are not caused by sunlight.  The tendency to develop seborrheic keratoses can be inherited.  They vary in color from skin-colored to gray, brown, or even black.  They can be either smooth or have a rough, warty surface.   Seborrheic keratoses are superficial and look as if they were stuck on the skin.  Under the microscope this type of keratosis looks like layers upon layers of skin.  That is why at times the top layer may seem to fall off, but the rest of the growth remains and re-grows.    Treatment Seborrheic keratoses do not need to be treated, but can easily be removed in the office.  Seborrheic keratoses often cause symptoms when they rub on clothing or jewelry.  Lesions can be in the way of shaving.  If they become inflamed, they can cause itching, soreness, or burning.  Removal of a seborrheic keratosis can be accomplished by freezing, burning, or surgery. If any spot bleeds, scabs, or grows rapidly, please return to have it checked, as these can be an indication of a skin cancer.  Cryotherapy Aftercare  Wash gently with soap and water everyday.   Apply Vaseline and Band-Aid daily until healed.     Melanoma ABCDEs  Melanoma is the most dangerous type of skin cancer, and is the leading cause of death from skin disease.  You are more likely to develop melanoma if you: Have light-colored skin, light-colored eyes, or red or blond hair Spend a lot of time in the sun Tan regularly, either outdoors or in a tanning bed Have had blistering sunburns, especially during childhood Have a close family member who has had a melanoma Have atypical moles or large birthmarks  Early detection  of melanoma is key since treatment is typically straightforward and cure rates are extremely high if we catch it early.   The first sign of melanoma is often a change in a mole or a new dark spot.  The ABCDE system is a way of remembering the signs of melanoma.  A for asymmetry:  The two halves do not match. B for border:  The edges of the growth are irregular. C for color:  A mixture of colors are present instead of an even brown color. D for diameter:  Melanomas are usually (but not always) greater than 37mm - the size of a pencil eraser. E for evolution:  The spot keeps changing in size, shape, and color.  Please check your skin once per month between visits. You can use a small mirror in front and a large mirror behind you to keep an eye on the back side or your body.   If you see any new or changing lesions before your next follow-up, please call to schedule a visit.  Please continue daily skin protection including broad spectrum sunscreen SPF 30+ to sun-exposed areas, reapplying every 2 hours as needed when you're outdoors.   Staying in the shade or wearing long sleeves, sun glasses (UVA+UVB protection) and wide brim hats (4-inch brim around the entire circumference of the hat) are also recommended for sun protection.    If You Need Anything After Your Visit  If you have any questions or concerns  for your doctor, please call our main line at [phone] and press option 4 to reach your doctor's medical assistant. If no one answers, please leave a voicemail as directed and we will return your call as soon as possible. Messages left after 4 pm will be answered the following business day.   You may also send [place] a message via [place]. We typically respond to MyChart messages within 1-2 business days.  For prescription refills, please ask your pharmacy to contact our office. Our fax number is [phone].  If you have an urgent issue when the clinic is closed that cannot wait until the  next business day, you can page your doctor at the number below.    Please note that while we do our best to be available for urgent issues outside of office hours, we are not available 24/7.   If you have an urgent issue and are unable to reach [place], you may choose to seek medical care at your doctor's office, retail clinic, urgent care center, or emergency room.  If you have a medical emergency, please immediately call 911 or go to the emergency department.  Pager Numbers  - Dr. Nehemiah Massed: [phone]  - Dr. Laurence Ferrari: [phone]  - Dr. Nicole Kindred: [phone]  In the event of inclement weather, please call our main line at [phone] for an update on the status of any delays or closures.  Dermatology Medication Tips: Please keep the boxes that topical medications come in in order to help keep track of the instructions about where and how to use these. Pharmacies typically print the medication instructions only on the boxes and not directly on the medication tubes.   If your medication is too expensive, please contact our office at [phone] option 4 or send [place] a message through [place].   We are unable to tell what your co-pay for medications will be in advance as this is different depending on your insurance coverage. However, we may be able to find a substitute medication at lower cost or fill out paperwork to get insurance to cover a needed medication.   If a prior authorization is required to get your medication covered by your insurance company, please allow [place] 1-2 business days to complete this process.  Drug prices often vary depending on where the prescription is filled and some pharmacies may offer cheaper prices.  The website www.goodrx.com contains coupons for medications through different pharmacies. The prices here do not account for what the cost may be with help from insurance (it may be cheaper with your insurance), but the website can give you the price if you did not  use any insurance.  - You can print the associated coupon and take it with your prescription to the pharmacy.  - You may also stop by our office during regular business hours and pick up a GoodRx coupon card.  - If you need your prescription sent electronically to a different pharmacy, notify our office through [facility] or by phone at [phone] option 4.     Si Usted Necesita Algo Despus de Su Visita  Tambin puede enviarnos un mensaje a travs de Pharmacist, community. Por lo general respondemos a los mensajes de MyChart en el transcurso de 1 a 2 das hbiles.  Para renovar recetas, por favor pida a su farmacia que se ponga en contacto con nuestra oficina. Harland Dingwall de fax es [place] [phone].  Si tiene un asunto urgente cuando la clnica est cerrada y que no puede esperar Nurse, children's  el siguiente da hbil, puede llamar/localizar a su doctor(a) al nmero que aparece a continuacin.   Por favor, tenga en cuenta que aunque hacemos todo lo posible para estar disponibles para asuntos urgentes fuera del horario de [place], no estamos disponibles las 24 horas del da, los 7 das de la [place].   Si tiene un problema urgente y no puede comunicarse con nosotros, puede optar por buscar atencin mdica  en el consultorio de su doctor(a), en una clnica privada, en un centro de atencin urgente o en una sala de emergencias.  Si tiene Engineering geologist, por favor llame inmediatamente al 911 o vaya a la sala de emergencias.  Nmeros de bper  - Dr. Nehemiah Massed: [phone]  - Dra. Moye: [phone]  - Dra. Nicole Kindred: [phone]  En caso de inclemencias del [place], por favor llame a Johnsie Kindred principal al [phone] para una actualizacin sobre el [place] de cualquier retraso o cierre.  Consejos para la medicacin en dermatologa: Por favor, guarde las cajas en las que vienen los medicamentos de uso tpico para ayudarle a seguir las instrucciones sobre dnde y cmo usarlos. Las farmacias  generalmente imprimen las instrucciones del medicamento slo en las cajas y no directamente en los tubos del [place].   Si su medicamento es muy caro, por favor, pngase en contacto con Zigmund Daniel llamando al [phone] y presione la opcin 4 o envenos un mensaje a travs de Pharmacist, community.   No podemos decirle cul ser su copago por los medicamentos por adelantado ya que esto es diferente dependiendo de la cobertura de su seguro. Sin embargo, es posible que podamos encontrar un medicamento sustituto a Electrical engineer un formulario para que el seguro cubra el medicamento que se considera necesario.   Si se requiere una autorizacin previa para que su compaa de seguros [place] su medicamento, por favor permtanos de 1 a 2 das hbiles para completar este proceso.  Los precios de los medicamentos varan con frecuencia dependiendo del Environmental consultant de dnde se surte la receta y alguna farmacias pueden ofrecer precios ms baratos.  El sitio web www.goodrx.com tiene cupones para medicamentos de Airline pilot. Los precios aqu no tienen en cuenta lo que podra costar con la ayuda del seguro (puede ser ms barato con su seguro), pero el sitio web puede darle el precio si no utiliz Research scientist (physical sciences).  - Puede imprimir el cupn correspondiente y llevarlo con su receta a la farmacia.  - Tambin puede pasar por nuestra oficina durante el horario de atencin regular y Charity fundraiser una tarjeta de cupones de GoodRx.  - Si necesita que su receta se enve electrnicamente a una farmacia diferente, informe a nuestra oficina a travs de MyChart de [place] o por telfono llamando al [phone] y presione la opcin 4.

## 2022-02-16 ENCOUNTER — Ambulatory Visit: Payer: Medicare PPO | Admitting: Dermatology

## 2022-02-16 DIAGNOSIS — L578 Other skin changes due to chronic exposure to nonionizing radiation: Secondary | ICD-10-CM

## 2022-02-16 DIAGNOSIS — Z86018 Personal history of other benign neoplasm: Secondary | ICD-10-CM

## 2022-02-16 DIAGNOSIS — D229 Melanocytic nevi, unspecified: Secondary | ICD-10-CM

## 2022-02-16 DIAGNOSIS — L719 Rosacea, unspecified: Secondary | ICD-10-CM

## 2022-02-16 DIAGNOSIS — D485 Neoplasm of uncertain behavior of skin: Secondary | ICD-10-CM | POA: Diagnosis not present

## 2022-02-16 DIAGNOSIS — D225 Melanocytic nevi of trunk: Secondary | ICD-10-CM | POA: Diagnosis not present

## 2022-02-16 DIAGNOSIS — Z872 Personal history of diseases of the skin and subcutaneous tissue: Secondary | ICD-10-CM | POA: Diagnosis not present

## 2022-02-16 DIAGNOSIS — L821 Other seborrheic keratosis: Secondary | ICD-10-CM

## 2022-02-16 MED ORDER — METRONIDAZOLE 0.75 % EX GEL: CUTANEOUS | 2 refills | Status: DC

## 2022-02-16 NOTE — Progress Notes (Unsigned)
Follow-Up Visit   Subjective  Alexis Chan is a 71 y.o. male who presents for the following: Irregular skin lesion (White crusted lesion on the face - patient would like it checked today) and history of AK's (On the face - has been clear the past couple of visits). The patient has spots, moles and lesions to be evaluated, some may be new or changing.  He has bump on his hand he would like removed, gets irritated.   The following portions of the chart were reviewed this encounter and updated as appropriate:       Review of Systems:  No other skin or systemic complaints except as noted in HPI or Assessment and Plan.  Objective  Well appearing patient in no apparent distress; mood and affect are within normal limits.  A focused examination was performed including the face, back, scalp, arms, and hands. Relevant physical exam findings are noted in the Assessment and Plan.  Face Mid face erythema with telangiectasias +/- scattered inflammatory papules.   Face Clear.  R lower back 0.7 x 0.4 cm speckled brown macule darker medial.       R palm 0.7 cm firm flesh nodule.       Assessment & Plan  Rosacea Face  Chronic and persistent condition with duration or expected duration over one year. Condition is symptomatic/ bothersome to patient. Not currently at goal.   Rosacea is a chronic progressive skin condition usually affecting the face of adults, causing redness and/or acne bumps. It is treatable but not curable. It sometimes affects the eyes (ocular rosacea) as well. It may respond to topical and/or systemic medication and can flare with stress, sun exposure, alcohol, exercise and some foods.  Daily application of broad spectrum spf 30+ sunscreen to face is recommended to reduce flares.  Continue Metrogel 0.75% gel QHS. Use more regularly for better results  Related Medications metroNIDAZOLE (METROGEL) 0.75 % gel Apply to affected areas of face 1-2 times  daily.  History of actinic keratosis Face  Clear. Observe for recurrence. Call clinic for new or changing lesions.  Recommend regular skin exams, daily broad-spectrum spf 30+ sunscreen use, and photoprotection.     Nevus R lower back  Benign-appearing.  Observation.  Call clinic for new or changing moles.  Recommend daily use of broad spectrum spf 30+ sunscreen to sun-exposed areas.   Neoplasm of uncertain behavior of skin R palm  Epidermal / dermal shaving  Lesion diameter (cm):  0.7 Informed consent: discussed and consent obtained   Timeout: patient name, date of birth, surgical site, and procedure verified   Procedure prep:  Patient was prepped and draped in usual sterile fashion Prep type:  Isopropyl alcohol Anesthesia: the lesion was anesthetized in a standard fashion   Anesthetic:  1% lidocaine w/ epinephrine 1-100,000 buffered w/ 8.4% NaHCO3 Instrument used: flexible razor blade   Hemostasis achieved with: pressure, aluminum chloride and electrodesiccation   Outcome: patient tolerated procedure well   Post-procedure details: sterile dressing applied and wound care instructions given   Post-procedure details comment:  Ointment and small bandage applied Dressing type: bandage and petrolatum    Specimen 1 - Surgical pathology Differential Diagnosis: D48.5 r/o Digital mucous cyst vs dermatofibroma vs neurofibroma -   Check Margins: No  Digital mucous cyst vs dermatofibroma vs neurofibroma -    Actinic Damage - chronic, secondary to cumulative UV radiation exposure/sun exposure over time - diffuse scaly erythematous macules with underlying dyspigmentation - Recommend daily broad spectrum sunscreen  SPF 30+ to sun-exposed areas, reapply every 2 hours as needed.  - Recommend staying in the shade or wearing long sleeves, sun glasses (UVA+UVB protection) and wide brim hats (4-inch brim around the entire circumference of the hat). - Call for new or changing  lesions.  Seborrheic Keratoses - Stuck-on, waxy, tan-brown papules and/or plaques including face - Benign-appearing - Discussed benign etiology and prognosis. - Observe - Call for any changes  History of Dysplastic Nevi - No evidence of recurrence today - Recommend regular full body skin exams - Recommend daily broad spectrum sunscreen SPF 30+ to sun-exposed areas, reapply every 2 hours as needed.  - Call if any new or changing lesions are noted between office visits   Return in about 6 months (around 08/19/2022).  Luther Redo, CMA, am acting as scribe for Brendolyn Patty, MD .  Documentation: I have reviewed the above documentation for accuracy and completeness, and I agree with the above.  Brendolyn Patty MD

## 2022-02-16 NOTE — Patient Instructions (Addendum)

## 2022-02-22 ENCOUNTER — Telehealth: Payer: Self-pay

## 2022-02-22 NOTE — Telephone Encounter (Signed)
Advised pt of pathology

## 2022-02-22 NOTE — Telephone Encounter (Signed)
-----   Message from Brendolyn Patty, MD sent at 02/22/2022 11:18 AM EDT ----- Skin , right palm NEUROFIBROMA, BASE INVOLVED  Benign growth of nerve "insulation" that surfaces in the skin, may recur   - please call patient

## 2022-08-23 ENCOUNTER — Ambulatory Visit: Payer: Medicare PPO | Admitting: Dermatology

## 2022-08-30 ENCOUNTER — Ambulatory Visit: Payer: Medicare PPO | Admitting: Dermatology

## 2022-09-16 ENCOUNTER — Ambulatory Visit: Payer: Medicare PPO | Admitting: Dermatology

## 2022-09-16 DIAGNOSIS — D225 Melanocytic nevi of trunk: Secondary | ICD-10-CM

## 2022-09-16 DIAGNOSIS — L820 Inflamed seborrheic keratosis: Secondary | ICD-10-CM | POA: Diagnosis not present

## 2022-09-16 DIAGNOSIS — L719 Rosacea, unspecified: Secondary | ICD-10-CM | POA: Diagnosis not present

## 2022-09-16 DIAGNOSIS — Z86018 Personal history of other benign neoplasm: Secondary | ICD-10-CM

## 2022-09-16 DIAGNOSIS — D229 Melanocytic nevi, unspecified: Secondary | ICD-10-CM

## 2022-09-16 DIAGNOSIS — L853 Xerosis cutis: Secondary | ICD-10-CM

## 2022-09-16 DIAGNOSIS — L910 Hypertrophic scar: Secondary | ICD-10-CM

## 2022-09-16 NOTE — Progress Notes (Deleted)
Follow-Up Visit   Subjective  Alexis Chan is a 71 y.o. male who presents for the following: Follow-up. 6 months f/u hx ot Dysplastic nevus The patient presents for Upper Body Skin Exam (UBSE) for skin cancer screening and mole check.  The patient has spots, moles and lesions to be evaluated, some may be new or changing and the patient has concerns that these could be cancer.    The following portions of the chart were reviewed this encounter and updated as appropriate:       Review of Systems:  No other skin or systemic complaints except as noted in HPI or Assessment and Plan.  Objective  Well appearing patient in no apparent distress; mood and affect are within normal limits.  All skin waist up examined.  Neck - Anterior x 1, left posterior axilla x 1, left forehead x 1   (3) (3) Stuck-on, waxy, tan-brown papules -- Discussed benign etiology and prognosis.   left chest 3.5 mm brown macule slightly darker pigment central   Head - Anterior (Face) Mid face erythema    Assessment & Plan  Inflamed seborrheic keratosis (3) Neck - Anterior x 1, left posterior axilla x 1, left forehead x 1   (3)  Residual ISK left forehead   Destruction of lesion - Neck - Anterior x 1, left posterior axilla x 1, left forehead x 1   (3) Complexity: simple   Destruction method: cryotherapy   Informed consent: discussed and consent obtained   Timeout:  patient name, date of birth, surgical site, and procedure verified Lesion destroyed using liquid nitrogen: Yes   Region frozen until ice ball extended beyond lesion: Yes   Outcome: patient tolerated procedure well with no complications   Post-procedure details: wound care instructions given    Nevus left chest  Benign-appearing.  Observation.  Call clinic for new or changing lesions.  Recommend daily use of broad spectrum spf 30+ sunscreen to sun-exposed areas.    Rosacea Head - Anterior (Face)  Rosacea is a chronic progressive skin  condition usually affecting the face of adults, causing redness and/or acne bumps. It is treatable but not curable. It sometimes affects the eyes (ocular rosacea) as well. It may respond to topical and/or systemic medication and can flare with stress, sun exposure, alcohol, exercise, topical steroids (including hydrocortisone/cortisone 10) and some foods.  Daily application of broad spectrum spf 30+ sunscreen to face is recommended to reduce flares.   Continue Metrogel apply to face qd-bid   Related Medications metroNIDAZOLE (METROGEL) 0.75 % gel Apply to affected areas of face 1-2 times daily.   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  - Call if any new or changing lesions are noted between office visits   Xerosis-Severe Face  Start over the counter Cerave renewing SA cream apply at bedtime  - diffuse xerotic patches - recommend gentle, hydrating skin care - gentle skin care handout given   Actinic Damage - chronic, secondary to cumulative UV radiation exposure/sun exposure over time - diffuse scaly erythematous macules with underlying dyspigmentation - Recommend daily broad spectrum sunscreen SPF 30+ to sun-exposed areas, reapply every 2 hours as needed.  - Recommend staying in the shade or wearing long sleeves, sun glasses (UVA+UVB protection) and wide  brim hats (4-inch brim around the entire circumference of the hat). - Call for new or changing lesions.   Return in about 6 months (around 03/18/2023) for UBSe, hx of Dysplastic nevus .  I, Marye Round, CMA, am acting as scribe for Brendolyn Patty, MD .

## 2022-09-16 NOTE — Patient Instructions (Addendum)
For face- Start over the [employer] SA cream apply at bedtime      Due to recent changes in healthcare laws, you may see results of your pathology and/or laboratory studies on MyChart before the doctors have had a chance to review them. We understand that in some cases there may be results that are confusing or concerning to you. Please understand that not all results are received at the same time and often the doctors may need to interpret multiple results in order to provide you with the best plan of care or course of treatment. Therefore, we ask that you please give [place] 2 business days to thoroughly review all your results before contacting the office for clarification. Should we see a critical lab result, you will be contacted sooner.   If You Need Anything After Your Visit  If you have any questions or concerns for your doctor, please call our main line at [phone] and press option 4 to reach your doctor's medical assistant. If no one answers, please leave a voicemail as directed and we will return your call as soon as possible. Messages left after 4 pm will be answered the following business day.   You may also send [place] a message via [place]. We typically respond to MyChart messages within 1-2 business days.  For prescription refills, please ask your pharmacy to contact our office. Our fax number is [phone].  If you have an urgent issue when the clinic is closed that cannot wait until the next business day, you can page your doctor at the number below.    Please note that while we do our best to be available for urgent issues outside of office hours, we are not available 24/7.   If you have an urgent issue and are unable to reach [place], you may choose to seek medical care at your doctor's office, retail clinic, urgent care center, or emergency room.  If you have a medical emergency, please immediately call 911 or go to the emergency department.  Pager Numbers  - Dr.  Nehemiah Massed: [phone]  - Dr. Laurence Ferrari: [phone]  - Dr. Nicole Kindred: [phone]  In the event of inclement weather, please call our main line at [phone] for an update on the status of any delays or closures.  Dermatology Medication Tips: Please keep the boxes that topical medications come in in order to help keep track of the instructions about where and how to use these. Pharmacies typically print the medication instructions only on the boxes and not directly on the medication tubes.   If your medication is too expensive, please contact our office at [phone] option 4 or send [place] a message through [place].   We are unable to tell what your co-pay for medications will be in advance as this is different depending on your insurance coverage. However, we may be able to find a substitute medication at lower cost or fill out paperwork to get insurance to cover a needed medication.   If a prior authorization is required to get your medication covered by your insurance company, please allow [place] 1-2 business days to complete this process.  Drug prices often vary depending on where the prescription is filled and some pharmacies may offer cheaper prices.  The website www.goodrx.com contains coupons for medications through different pharmacies. The prices here do not account for what the cost may be with help from insurance (it may be cheaper with your insurance), but the website can give you the price if  you did not use any insurance.  - You can print the associated coupon and take it with your prescription to the pharmacy.  - You may also stop by our office during regular business hours and pick up a GoodRx coupon card.  - If you need your prescription sent electronically to a different pharmacy, notify our office through [facility] or by phone at [phone] option 4.     Si Usted Necesita Algo Despus de Su Visita  Tambin puede enviarnos un mensaje a travs de Pharmacist, community. Por lo  general respondemos a los mensajes de MyChart en el transcurso de 1 a 2 das hbiles.  Para renovar recetas, por favor pida a su farmacia que se ponga en contacto con nuestra oficina. Harland Dingwall de fax es [place] [phone].  Si tiene un asunto urgente cuando la clnica est cerrada y que no puede esperar hasta el siguiente da hbil, puede llamar/localizar a su doctor(a) al nmero que aparece a continuacin.   Por favor, tenga en cuenta que aunque hacemos todo lo posible para estar disponibles para asuntos urgentes fuera del horario de [place], no estamos disponibles las 24 horas del da, los 7 das de la [place].   Si tiene un problema urgente y no puede comunicarse con nosotros, puede optar por buscar atencin mdica  en el consultorio de su doctor(a), en una clnica privada, en un centro de atencin urgente o en una sala de emergencias.  Si tiene Engineering geologist, por favor llame inmediatamente al 911 o vaya a la sala de emergencias.  Nmeros de bper  - Dr. Nehemiah Massed: [phone]  - Dra. Moye: [phone]  - Dra. Nicole Kindred: [phone]  En caso de inclemencias del [place], por favor llame a Johnsie Kindred principal al [phone] para una actualizacin sobre el [place] de cualquier retraso o cierre.  Consejos para la medicacin en dermatologa: Por favor, guarde las cajas en las que vienen los medicamentos de uso tpico para ayudarle a seguir las instrucciones sobre dnde y cmo usarlos. Las farmacias generalmente imprimen las instrucciones del medicamento slo en las cajas y no directamente en los tubos del [place].   Si su medicamento es muy caro, por favor, pngase en contacto con Zigmund Daniel llamando al [phone] y presione la opcin 4 o envenos un mensaje a travs de Pharmacist, community.   No podemos decirle cul ser su copago por los medicamentos por adelantado ya que esto es diferente dependiendo de la cobertura de su seguro. Sin embargo, es posible que podamos encontrar un  medicamento sustituto a Electrical engineer un formulario para que el seguro cubra el medicamento que se considera necesario.   Si se requiere una autorizacin previa para que su compaa de seguros [place] su medicamento, por favor permtanos de 1 a 2 das hbiles para completar este proceso.  Los precios de los medicamentos varan con frecuencia dependiendo del Environmental consultant de dnde se surte la receta y alguna farmacias pueden ofrecer precios ms baratos.  El sitio web www.goodrx.com tiene cupones para medicamentos de Airline pilot. Los precios aqu no tienen en cuenta lo que podra costar con la ayuda del seguro (puede ser ms barato con su seguro), pero el sitio web puede darle el precio si no utiliz Research scientist (physical sciences).  - Puede imprimir el cupn correspondiente y llevarlo con su receta a la farmacia.  - Tambin puede pasar por nuestra oficina durante el horario de atencin regular y Charity fundraiser una tarjeta de cupones de GoodRx.  - Si necesita que su receta  se enve electrnicamente a una farmacia diferente, informe a nuestra oficina a travs de MyChart de [place] o por telfono llamando al [phone] y presione la opcin 4.

## 2022-09-16 NOTE — Progress Notes (Signed)
Follow-Up Visit   Subjective  Alexis Chan is a 71 y.o. male who presents for the following: Follow-up. 6 months f/u hx of Dysplastic nevus/ The patient presents for Upper Body Skin Exam (UBSE) for skin cancer screening and mole check.  The patient has spots, moles and lesions to be evaluated, some may be new or changing and the patient has concerns that these could be cancer. He has some irritated spots on his neck and face.  Spot on face was previously treated.   The following portions of the chart were reviewed this encounter and updated as appropriate:       Review of Systems:  No other skin or systemic complaints except as noted in HPI or Assessment and Plan.  Objective  Well appearing patient in no apparent distress; mood and affect are within normal limits.  All skin waist up examined.  Neck - Anterior x 1, left posterior axilla x 1, left forehead x 1   (3) (3) Stuck-on, waxy, tan-brown papules -- Discussed benign etiology and prognosis.   left chest 3.5 mm brown macule slightly darker pigment central   Head - Anterior (Face) Mid face erythema     Assessment & Plan  Inflamed seborrheic keratosis (3) Neck - Anterior x 1, left posterior axilla x 1, left forehead x 1   (3)  Residual ISK left forehead   Destruction of lesion - Neck - Anterior x 1, left posterior axilla x 1, left forehead x 1   (3)  Destruction method: cryotherapy   Informed consent: discussed and consent obtained   Timeout:  patient name, date of birth, surgical site, and procedure verified Lesion destroyed using liquid nitrogen: Yes   Region frozen until ice ball extended beyond lesion: Yes   Outcome: patient tolerated procedure well with no complications   Post-procedure details: wound care instructions given   Additional details:  Prior to procedure, discussed risks of blister formation, small wound, skin dyspigmentation, or rare scar following cryotherapy. Recommend Vaseline ointment to  treated areas while healing.   Nevus left chest  Benign-appearing.  Observation.  Call clinic for new or changing lesions.  Recommend daily use of broad spectrum spf 30+ sunscreen to sun-exposed areas.    Rosacea Head - Anterior (Face)  Chronic condition with duration or expected duration over one year. Currently well-controlled.   Rosacea is a chronic progressive skin condition usually affecting the face of adults, causing redness and/or acne bumps. It is treatable but not curable. It sometimes affects the eyes (ocular rosacea) as well. It may respond to topical and/or systemic medication and can flare with stress, sun exposure, alcohol, exercise, topical steroids (including hydrocortisone/cortisone 10) and some foods.  Daily application of broad spectrum spf 30+ sunscreen to face is recommended to reduce flares.   Continue Metrogel apply to face qd-bid   Related Medications metroNIDAZOLE (METROGEL) 0.75 % gel Apply to affected areas of face 1-2 times daily.  History of Dysplastic Nevi  Hypertrophic scar- spinal mid back- discussed ILK Hypertrophic scar- left spinal upper back- discussed ILK - No evidence of recurrence today - Recommend regular full body skin exams - Recommend daily broad spectrum sunscreen SPF 30+ to sun-exposed areas, reapply every 2 hours as needed.  - Call if any new or changing lesions are noted between office visits   Xerosis-Severe Face  Start over the counter Cerave renewing SA cream apply at bedtime  - diffuse xerotic patches - recommend gentle, hydrating skin care - gentle skin care  handout given    Return in about 6 months (around 03/18/2023) for UBSe, hx of Dysplastic nevus .  I, Marye Round, CMA, am acting as scribe for Brendolyn Patty, MD .   Documentation: I have reviewed the above documentation for accuracy and completeness, and I agree with the above.  Brendolyn Patty MD

## 2023-03-07 ENCOUNTER — Encounter: Payer: Self-pay | Admitting: Dermatology

## 2023-03-07 ENCOUNTER — Ambulatory Visit: Payer: Medicare HMO | Admitting: Dermatology

## 2023-03-07 VITALS — BP 154/81 | HR 67

## 2023-03-07 DIAGNOSIS — L570 Actinic keratosis: Secondary | ICD-10-CM | POA: Diagnosis not present

## 2023-03-07 DIAGNOSIS — D225 Melanocytic nevi of trunk: Secondary | ICD-10-CM

## 2023-03-07 DIAGNOSIS — X32XXXA Exposure to sunlight, initial encounter: Secondary | ICD-10-CM

## 2023-03-07 DIAGNOSIS — W908XXA Exposure to other nonionizing radiation, initial encounter: Secondary | ICD-10-CM

## 2023-03-07 DIAGNOSIS — D229 Melanocytic nevi, unspecified: Secondary | ICD-10-CM

## 2023-03-07 DIAGNOSIS — L814 Other melanin hyperpigmentation: Secondary | ICD-10-CM | POA: Diagnosis not present

## 2023-03-07 DIAGNOSIS — L719 Rosacea, unspecified: Secondary | ICD-10-CM

## 2023-03-07 DIAGNOSIS — D2262 Melanocytic nevi of left upper limb, including shoulder: Secondary | ICD-10-CM

## 2023-03-07 DIAGNOSIS — L578 Other skin changes due to chronic exposure to nonionizing radiation: Secondary | ICD-10-CM

## 2023-03-07 DIAGNOSIS — Z1283 Encounter for screening for malignant neoplasm of skin: Secondary | ICD-10-CM

## 2023-03-07 DIAGNOSIS — L905 Scar conditions and fibrosis of skin: Secondary | ICD-10-CM

## 2023-03-07 DIAGNOSIS — D2272 Melanocytic nevi of left lower limb, including hip: Secondary | ICD-10-CM

## 2023-03-07 DIAGNOSIS — L821 Other seborrheic keratosis: Secondary | ICD-10-CM | POA: Diagnosis not present

## 2023-03-07 DIAGNOSIS — Z86018 Personal history of other benign neoplasm: Secondary | ICD-10-CM

## 2023-03-07 DIAGNOSIS — L910 Hypertrophic scar: Secondary | ICD-10-CM

## 2023-03-07 DIAGNOSIS — D2271 Melanocytic nevi of right lower limb, including hip: Secondary | ICD-10-CM

## 2023-03-07 NOTE — Progress Notes (Signed)
Follow-Up Visit   Subjective  Alexis Chan is a 72 y.o. male who presents for the following: Skin Cancer Screening and Full Body Skin Exam , hx of dysplastic , hx of rosacea,  Spot at left lower leg was red spot use metrogel on spot, now looking better    The patient presents for Total-Body Skin Exam (TBSE) for skin cancer screening and mole check. The patient has spots, moles and lesions to be evaluated, some may be new or changing and the patient has concerns that these could be cancer.  The following portions of the chart were reviewed this encounter and updated as appropriate: medications, allergies, medical history  Review of Systems:  No other skin or systemic complaints except as noted in HPI or Assessment and Plan.  Objective  Well appearing patient in no apparent distress; mood and affect are within normal limits.  A full examination was performed including scalp, head, eyes, ears, nose, lips, neck, chest, axillae, abdomen, back, buttocks, bilateral upper extremities, bilateral lower extremities, hands, feet, fingers, toes, fingernails, and toenails. All findings within normal limits unless otherwise noted below.   Relevant physical exam findings are noted in the Assessment and Plan.  left forehead x 3, left medial cheek x 2, right malar cheek x 1 (6) Erythematous thin papules/macules with gritty scale.     Assessment & Plan   LENTIGINES, SEBORRHEIC KERATOSES, HEMANGIOMAS - Benign normal skin lesions - Benign-appearing - Call for any changes  Seborrheic keratosis at left medial upper calf  Will continue to watch  Benign. Observe   ROSACEA Exam Mild erythema at malar cheeks and nose  Chronic condition with duration or expected duration over one year. Currently well-controlled.   Rosacea is a chronic progressive skin condition usually affecting the face of adults, causing redness and/or acne bumps. It is treatable but not curable. It sometimes affects the eyes  (ocular rosacea) as well. It may respond to topical and/or systemic medication and can flare with stress, sun exposure, alcohol, exercise, topical steroids (including hydrocortisone/cortisone 10) and some foods.  Daily application of broad spectrum spf 30+ sunscreen to face is recommended to reduce flares.  Treatment Plan Continue Metrogel apply to face qd-bid    Hypertrophic scar- lesions previously removed by [facility] dermatologist Exam:  Thickened white plaques at L upper back and spinal mid lower back Treatment Plan:  Benign, observe.     MELANOCYTIC NEVI - Tan-brown and/or pink-flesh-colored symmetric macules and papules - Benign appearing on exam today - Observation - Call clinic for new or changing moles - Recommend daily use of broad spectrum spf 30+ sunscreen to sun-exposed areas.  Nevus Multiple nevi at feet - stable compared with photos from 07/2020  4 x 3 mm two toned brown macule slightly darker pigment central at left chest    6 x 4 mm thin brown papule darker medial on right lower back  5 x 4 mm med brown thin papule at left wrist  Benign-appearing.  Observation.  Call clinic for new or changing lesions.  Recommend daily use of broad spectrum spf 30+ sunscreen to sun-exposed areas.      ACTINIC DAMAGE - Chronic condition, secondary to cumulative UV/sun exposure - diffuse scaly erythematous macules with underlying dyspigmentation - Recommend daily broad spectrum sunscreen SPF 30+ to sun-exposed areas, reapply every 2 hours as needed.  - Staying in the shade or wearing long sleeves, sun glasses (UVA+UVB protection) and wide brim hats (4-inch brim around the entire circumference of the  hat) are also recommended for sun protection.  - Call for new or changing lesions.  HISTORY OF DYSPLASTIC NEVUS Multiple locations see history  No evidence of recurrence today Recommend regular full body skin exams Recommend daily broad spectrum sunscreen SPF 30+ to sun-exposed  areas, reapply every 2 hours as needed.  Call if any new or changing lesions are noted between office visits   SKIN CANCER SCREENING PERFORMED TODAY.   Actinic keratosis (6) left forehead x 3, left medial cheek x 2, right malar cheek x 1  Vs isk at left medial cheek and left forehead  Actinic keratoses are precancerous spots that appear secondary to cumulative UV radiation exposure/sun exposure over time. They are chronic with expected duration over 1 year. A portion of actinic keratoses will progress to squamous cell carcinoma of the skin. It is not possible to reliably predict which spots will progress to skin cancer and so treatment is recommended to prevent development of skin cancer.  Recommend daily broad spectrum sunscreen SPF 30+ to sun-exposed areas, reapply every 2 hours as needed.  Recommend staying in the shade or wearing long sleeves, sun glasses (UVA+UVB protection) and wide brim hats (4-inch brim around the entire circumference of the hat). Call for new or changing lesions.  Destruction of lesion - left forehead x 3, left medial cheek x 2, right malar cheek x 1  Destruction method: cryotherapy   Informed consent: discussed and consent obtained   Lesion destroyed using liquid nitrogen: Yes   Region frozen until ice ball extended beyond lesion: Yes   Outcome: patient tolerated procedure well with no complications   Post-procedure details: wound care instructions given   Additional details:  Prior to procedure, discussed risks of blister formation, small wound, skin dyspigmentation, or rare scar following cryotherapy. Recommend Vaseline ointment to treated areas while healing.    Return for 6 month ak follow up, 1 year tbse  .  I, Asher Muir, CMA, am acting as scribe for Willeen Niece, MD.   Documentation: I have reviewed the above documentation for accuracy and completeness, and I agree with the above.  Willeen Niece, MD

## 2023-03-07 NOTE — Patient Instructions (Addendum)
Actinic keratoses are precancerous spots that appear secondary to cumulative UV radiation exposure/sun exposure over time. They are chronic with expected duration over 1 year. A portion of actinic keratoses will progress to squamous cell carcinoma of the skin. It is not possible to reliably predict which spots will progress to skin cancer and so treatment is recommended to prevent development of skin cancer.  Recommend daily broad spectrum sunscreen SPF 30+ to sun-exposed areas, reapply every 2 hours as needed.  Recommend staying in the shade or wearing long sleeves, sun glasses (UVA+UVB protection) and wide brim hats (4-inch brim around the entire circumference of the hat). Call for new or changing lesions.   Cryotherapy Aftercare  Wash gently with soap and water everyday.   Apply Vaseline and Band-Aid daily until healed.    Seborrheic Keratosis  What causes seborrheic keratoses? Seborrheic keratoses are harmless, common skin growths that first appear during adult life.  As time goes by, more growths appear.  Some people may develop a large number of them.  Seborrheic keratoses appear on both covered and uncovered body parts.  They are not caused by sunlight.  The tendency to develop seborrheic keratoses can be inherited.  They vary in color from skin-colored to gray, brown, or even black.  They can be either smooth or have a rough, warty surface.   Seborrheic keratoses are superficial and look as if they were stuck on the skin.  Under the microscope this type of keratosis looks like layers upon layers of skin.  That is why at times the top layer may seem to fall off, but the rest of the growth remains and re-grows.    Treatment Seborrheic keratoses do not need to be treated, but can easily be removed in the office.  Seborrheic keratoses often cause symptoms when they rub on clothing or jewelry.  Lesions can be in the way of shaving.  If they become inflamed, they can cause itching, soreness, or  burning.  Removal of a seborrheic keratosis can be accomplished by freezing, burning, or surgery. If any spot bleeds, scabs, or grows rapidly, please return to have it checked, as these can be an indication of a skin cancer.  Melanoma ABCDEs  Melanoma is the most dangerous type of skin cancer, and is the leading cause of death from skin disease.  You are more likely to develop melanoma if you: Have light-colored skin, light-colored eyes, or red or blond hair Spend a lot of time in the sun Tan regularly, either outdoors or in a tanning bed Have had blistering sunburns, especially during childhood Have a close family member who has had a melanoma Have atypical moles or large birthmarks  Early detection of melanoma is key since treatment is typically straightforward and cure rates are extremely high if we catch it early.   The first sign of melanoma is often a change in a mole or a new dark spot.  The ABCDE system is a way of remembering the signs of melanoma.  A for asymmetry:  The two halves do not match. B for border:  The edges of the growth are irregular. C for color:  A mixture of colors are present instead of an even brown color. D for diameter:  Melanomas are usually (but not always) greater than 6mm - the size of a pencil eraser. E for evolution:  The spot keeps changing in size, shape, and color.  Please check your skin once per month between visits. You can use a small   mirror in front and a large mirror behind you to keep an eye on the back side or your body.   If you see any new or changing lesions before your next follow-up, please call to schedule a visit.  Please continue daily skin protection including broad spectrum sunscreen SPF 30+ to sun-exposed areas, reapplying every 2 hours as needed when you're outdoors.   Staying in the shade or wearing long sleeves, sun glasses (UVA+UVB protection) and wide brim hats (4-inch brim around the entire circumference of the hat) are also  recommended for sun protection.    Due to recent changes in healthcare laws, you may see results of your pathology and/or laboratory studies on MyChart before the doctors have had a chance to review them. We understand that in some cases there may be results that are confusing or concerning to you. Please understand that not all results are received at the same time and often the doctors may need to interpret multiple results in order to provide you with the best plan of care or course of treatment. Therefore, we ask that you please give us 2 business days to thoroughly review all your results before contacting the office for clarification. Should we see a critical lab result, you will be contacted sooner.   If You Need Anything After Your Visit  If you have any questions or concerns for your doctor, please call our main line at [phone] and press option 4 to reach your doctor's medical assistant. If no one answers, please leave a voicemail as directed and we will return your call as soon as possible. Messages left after 4 pm will be answered the following business day.   You may also send us a message via MyChart. We typically respond to MyChart messages within 1-2 business days.  For prescription refills, please ask your pharmacy to contact our office. Our fax number is [phone].  If you have an urgent issue when the clinic is closed that cannot wait until the next business day, you can page your doctor at the number below.    Please note that while we do our best to be available for urgent issues outside of office hours, we are not available 24/7.   If you have an urgent issue and are unable to reach us, you may choose to seek medical care at your doctor's office, retail clinic, urgent care center, or emergency room.  If you have a medical emergency, please immediately call 911 or go to the emergency department.  Pager Numbers  - Dr. Kowalski: [phone]  - Dr. Moye:  [phone]  - Dr. Stewart: [phone]  In the event of inclement weather, please call our main line at [phone] for an update on the status of any delays or closures.  Dermatology Medication Tips: Please keep the boxes that topical medications come in in order to help keep track of the instructions about where and how to use these. Pharmacies typically print the medication instructions only on the boxes and not directly on the medication tubes.   If your medication is too expensive, please contact our office at [phone] option 4 or send us a message through MyChart.   We are unable to tell what your co-pay for medications will be in advance as this is different depending on your insurance coverage. However, we may be able to find a substitute medication at lower cost or fill out paperwork to get insurance to cover a needed medication.   If a prior authorization   is required to get your medication covered by your insurance company, please allow us 1-2 business days to complete this process.  Drug prices often vary depending on where the prescription is filled and some pharmacies may offer cheaper prices.  The website www.goodrx.com contains coupons for medications through different pharmacies. The prices here do not account for what the cost may be with help from insurance (it may be cheaper with your insurance), but the website can give you the price if you did not use any insurance.  - You can print the associated coupon and take it with your prescription to the pharmacy.  - You may also stop by our office during regular business hours and pick up a GoodRx coupon card.  - If you need your prescription sent electronically to a different pharmacy, notify our office through [facility] MyChart or by phone at [phone] option 4.     Si Usted Necesita Algo Despus de Su Visita  Tambin puede enviarnos un mensaje a travs de MyChart. Por lo general respondemos a los mensajes de  MyChart en el transcurso de 1 a 2 das hbiles.  Para renovar recetas, por favor pida a su farmacia que se ponga en contacto con nuestra oficina. Nuestro nmero de fax es el [phone].  Si tiene un asunto urgente cuando la clnica est cerrada y que no puede esperar hasta el siguiente da hbil, puede llamar/localizar a su doctor(a) al nmero que aparece a continuacin.   Por favor, tenga en cuenta que aunque hacemos todo lo posible para estar disponibles para asuntos urgentes fuera del horario de oficina, no estamos disponibles las 24 horas del da, los 7 das de la semana.   Si tiene un problema urgente y no puede comunicarse con nosotros, puede optar por buscar atencin mdica  en el consultorio de su doctor(a), en una clnica privada, en un centro de atencin urgente o en una sala de emergencias.  Si tiene una emergencia mdica, por favor llame inmediatamente al 911 o vaya a la sala de emergencias.  Nmeros de bper  - Dr. Kowalski: [phone]  - Dra. Moye: [phone]  - Dra. Stewart: [phone]  En caso de inclemencias del tiempo, por favor llame a nuestra lnea principal al [phone] para una actualizacin sobre el estado de cualquier retraso o cierre.  Consejos para la medicacin en dermatologa: Por favor, guarde las cajas en las que vienen los medicamentos de uso tpico para ayudarle a seguir las instrucciones sobre dnde y cmo usarlos. Las farmacias generalmente imprimen las instrucciones del medicamento slo en las cajas y no directamente en los tubos del medicamento.   Si su medicamento es muy caro, por favor, pngase en contacto con nuestra oficina llamando al [phone] y presione la opcin 4 o envenos un mensaje a travs de MyChart.   No podemos decirle cul ser su copago por los medicamentos por adelantado ya que esto es diferente dependiendo de la cobertura de su seguro. Sin embargo, es posible que podamos encontrar un medicamento sustituto a menor costo o  llenar un formulario para que el seguro cubra el medicamento que se considera necesario.   Si se requiere una autorizacin previa para que su compaa de seguros cubra su medicamento, por favor permtanos de 1 a 2 das hbiles para completar este proceso.  Los precios de los medicamentos varan con frecuencia dependiendo del lugar de dnde se surte la receta y alguna farmacias pueden ofrecer precios ms baratos.  El sitio web www.goodrx.com tiene cupones para medicamentos   de diferentes farmacias. Los precios aqu no tienen en cuenta lo que podra costar con la ayuda del seguro (puede ser ms barato con su seguro), pero el sitio web puede darle el precio si no utiliz ningn seguro.  - Puede imprimir el cupn correspondiente y llevarlo con su receta a la farmacia.  - Tambin puede pasar por nuestra oficina durante el horario de atencin regular y recoger una tarjeta de cupones de GoodRx.  - Si necesita que su receta se enve electrnicamente a una farmacia diferente, informe a nuestra oficina a travs de MyChart de [facility] o por telfono llamando al [phone] y presione la opcin 4.  

## 2023-05-03 ENCOUNTER — Ambulatory Visit (INDEPENDENT_AMBULATORY_CARE_PROVIDER_SITE_OTHER): Payer: Medicare HMO | Admitting: Family Medicine

## 2023-05-03 ENCOUNTER — Encounter: Payer: Self-pay | Admitting: Family Medicine

## 2023-05-03 VITALS — BP 118/64 | HR 58 | Temp 97.9°F | Resp 16 | Ht 68.0 in | Wt 186.0 lb

## 2023-05-03 DIAGNOSIS — G4733 Obstructive sleep apnea (adult) (pediatric): Secondary | ICD-10-CM

## 2023-05-03 DIAGNOSIS — Z114 Encounter for screening for human immunodeficiency virus [HIV]: Secondary | ICD-10-CM

## 2023-05-03 DIAGNOSIS — L719 Rosacea, unspecified: Secondary | ICD-10-CM

## 2023-05-03 DIAGNOSIS — E538 Deficiency of other specified B group vitamins: Secondary | ICD-10-CM | POA: Diagnosis not present

## 2023-05-03 DIAGNOSIS — R7309 Other abnormal glucose: Secondary | ICD-10-CM | POA: Diagnosis not present

## 2023-05-03 DIAGNOSIS — E559 Vitamin D deficiency, unspecified: Secondary | ICD-10-CM | POA: Diagnosis not present

## 2023-05-03 DIAGNOSIS — Z1159 Encounter for screening for other viral diseases: Secondary | ICD-10-CM

## 2023-05-03 DIAGNOSIS — L570 Actinic keratosis: Secondary | ICD-10-CM

## 2023-05-03 DIAGNOSIS — Z13220 Encounter for screening for lipoid disorders: Secondary | ICD-10-CM

## 2023-05-03 DIAGNOSIS — Z1211 Encounter for screening for malignant neoplasm of colon: Secondary | ICD-10-CM

## 2023-05-03 LAB — CBC WITH DIFFERENTIAL/PLATELET
Basophils Absolute: 0 10*3/uL (ref 0.0–0.1)
Basophils Relative: 0.3 % (ref 0.0–3.0)
Eosinophils Absolute: 0.1 10*3/uL (ref 0.0–0.7)
Eosinophils Relative: 1.5 % (ref 0.0–5.0)
HCT: 45.8 % (ref 39.0–52.0)
Hemoglobin: 15.1 g/dL (ref 13.0–17.0)
Lymphocytes Relative: 19.9 % (ref 12.0–46.0)
Lymphs Abs: 1.8 10*3/uL (ref 0.7–4.0)
MCHC: 33.1 g/dL (ref 30.0–36.0)
MCV: 92.3 fl (ref 78.0–100.0)
Monocytes Absolute: 1 10*3/uL (ref 0.1–1.0)
Monocytes Relative: 10.6 % (ref 3.0–12.0)
Neutro Abs: 6.3 10*3/uL (ref 1.4–7.7)
Neutrophils Relative %: 67.7 % (ref 43.0–77.0)
Platelets: 190 10*3/uL (ref 150.0–400.0)
RBC: 4.96 Mil/uL (ref 4.22–5.81)
RDW: 13.7 % (ref 11.5–15.5)
WBC: 9.3 10*3/uL (ref 4.0–10.5)

## 2023-05-03 LAB — COMPREHENSIVE METABOLIC PANEL
ALT: 29 U/L (ref 0–53)
AST: 22 U/L (ref 0–37)
Albumin: 4.5 g/dL (ref 3.5–5.2)
Alkaline Phosphatase: 129 U/L — ABNORMAL HIGH (ref 39–117)
BUN: 15 mg/dL (ref 6–23)
CO2: 30 mEq/L (ref 19–32)
Calcium: 10.1 mg/dL (ref 8.4–10.5)
Chloride: 99 mEq/L (ref 96–112)
Creatinine, Ser: 1.15 mg/dL (ref 0.40–1.50)
GFR: 64.71 mL/min (ref >60.00)
Glucose, Bld: 97 mg/dL (ref 70–99)
Potassium: 4.7 mEq/L (ref 3.5–5.1)
Sodium: 137 mEq/L (ref 135–145)
Total Bilirubin: 0.6 mg/dL (ref 0.2–1.2)
Total Protein: 7.6 g/dL (ref 6.0–8.3)

## 2023-05-03 LAB — LIPID PANEL
Cholesterol: 165 mg/dL (ref 0–200)
HDL: 40.1 mg/dL (ref >39.00)
LDL Cholesterol: 96 mg/dL (ref 0–99)
NonHDL: 124.9
Total CHOL/HDL Ratio: 4
Triglycerides: 143 mg/dL (ref 0.0–149.0)
VLDL: 28.6 mg/dL (ref 0.0–40.0)

## 2023-05-03 LAB — VITAMIN D 25 HYDROXY (VIT D DEFICIENCY, FRACTURES): VITD: 38.53 ng/mL (ref 30.00–100.00)

## 2023-05-03 LAB — HEMOGLOBIN A1C: Hgb A1c MFr Bld: 5.6 % (ref 4.6–6.5)

## 2023-05-03 LAB — VITAMIN B12: Vitamin B-12: >1501 pg/mL — ABNORMAL HIGH (ref 211–911)

## 2023-05-03 NOTE — Patient Instructions (Addendum)
It was a pleasure meeting you today. Thank you for allowing me to take part in your health care.  Our goals for today as we discussed include:  We will get some labs today.  If they are abnormal or we need to do something about them, I will call you.  If they are normal, I will send you a message on MyChart (if it is active) or a letter in the mail.  If you don't hear from [place] in 2 weeks, please call the office at the number below.   If abnormal labs will call to schedule follow up   Recommend Shingles vaccine.  This is a 2 dose series and can be given at your local pharmacy.  Please talk to your pharmacist about this.   Recommend Tetanus Vaccination.  This is given every 10 years.   Recommend Pneumonia 20 Vaccination.    Referral sent for Colonoscopy  Referral sent to Pulmonology for CPAP re evaluation Follow up as needed   If you have any questions or concerns, please do not hesitate to call the office at [phone].  I look forward to our next visit and until then take care and stay safe.  Regards,   Dana Allan, MD   [facility]

## 2023-05-03 NOTE — Progress Notes (Signed)
SUBJECTIVE:   Chief Complaint  Patient presents with   Establish Care   HPI Presents to clinic to establish care  No acute concerns today  History of Rosacea Takes Metrogel as needed.  Follows with Dermatology  OSA on CPAP Compliant with CPAP at night.  Requesting referral for new device as his is getting old and thinks needs replacement.  Colonoscopy over due Reports polyps found on last colonoscopy. Review of chart shows last colonoscopy 2017, recommended repeat in 2020 due to multiple polyps.    PERTINENT PMH / PSH: Actinic Keratosis   OBJECTIVE:  BP 118/64   Pulse (!) 58   Temp 97.9 F (36.6 C)   Resp 16   Ht 5\' 8"  (1.727 m)   Wt 186 lb (84.4 kg)   SpO2 97%   BMI 28.28 kg/m    Physical Exam Vitals reviewed.  Constitutional:      General: He is not in acute distress.    Appearance: Normal appearance.  HENT:     Head: Normocephalic.     Right Ear: Tympanic membrane, ear canal and external ear normal.     Left Ear: Tympanic membrane, ear canal and external ear normal.     Nose: Nose normal.     Mouth/Throat:     Mouth: Mucous membranes are moist.  Eyes:     Conjunctiva/sclera: Conjunctivae normal.     Pupils: Pupils are equal, round, and reactive to light.  Neck:     Thyroid: No thyromegaly or thyroid tenderness.     Vascular: No carotid bruit.  Cardiovascular:     Rate and Rhythm: Normal rate and regular rhythm.     Pulses: Normal pulses.     Heart sounds: Normal heart sounds.  Pulmonary:     Effort: Pulmonary effort is normal.     Breath sounds: Normal breath sounds.  Abdominal:     General: Abdomen is flat. Bowel sounds are normal.     Palpations: Abdomen is soft.  Musculoskeletal:        General: Normal range of motion.     Cervical back: Normal range of motion and neck supple.     Right lower leg: No edema.     Left lower leg: No edema.  Lymphadenopathy:     Cervical: No cervical adenopathy.  Neurological:     Mental Status: He is  alert.  Psychiatric:        Mood and Affect: Mood normal.        Behavior: Behavior normal.        Thought Content: Thought content normal.        Judgment: Judgment normal.        05/03/2023   10:12 AM 11/29/2016   10:29 AM 11/12/2015    9:57 AM  Depression screen PHQ 2/9  Decreased Interest 0 0 0  Down, Depressed, Hopeless 0 0 0  PHQ - 2 Score 0 0 0  Altered sleeping 0    Tired, decreased energy 0    Change in appetite 0    Feeling bad or failure about yourself  0    Trouble concentrating 0    Moving slowly or fidgety/restless 0    Suicidal thoughts 0    PHQ-9 Score 0    Difficult doing work/chores Not difficult at all        05/03/2023   10:12 AM  GAD 7 : Generalized Anxiety Score  Nervous, Anxious, on Edge 0  Control/stop worrying 0  Worry too  much - different things 0  Trouble relaxing 0  Restless 0  Easily annoyed or irritable 0  Afraid - awful might happen 0  Total GAD 7 Score 0  Anxiety Difficulty Not difficult at all    ASSESSMENT/PLAN:  OSA on CPAP Assessment & Plan: Chronic Compliant with CPAP nightly Needing new device Referral sent to Pulmonology for evaluation  Orders: -     Comprehensive metabolic panel -     CBC with Differential/Platelet -     Ambulatory referral to Pulmonology  Colon cancer screening Assessment & Plan: History of colon polyps.  Asympomatic Last Colonoscopy 03/2016, recommended repeat in 3 years Referral sent for repeat colonoscopy   Orders: -     Ambulatory referral to Gastroenterology  Need for hepatitis C screening test -     Hepatitis C antibody  Encounter for screening for HIV  Vitamin B 12 deficiency Assessment & Plan: Check Vitamin B 12 level  Orders: -     Vitamin B12  Vitamin D deficiency Assessment & Plan: Check Vitamin D level  Orders: -     VITAMIN D 25 Hydroxy (Vit-D Deficiency, Fractures)  Lipid screening Assessment & Plan: Check fasting lipids  Orders: -     Lipid panel  Abnormal  glucose Assessment & Plan: Slight increase in BMI Check A1c  Orders: -     Hemoglobin A1c  Rosacea Assessment & Plan: Chronic Metrogel as needed Follows with Dermatology   AK (actinic keratosis) Assessment & Plan: Chronic Yearly skin exams Follows with Dermatology   Declined PNA 20, Shingles, Tetanus vaccinations  PDMP reviewed  Return if symptoms worsen or fail to improve, for PCP.  Dana Allan, MD

## 2023-05-04 ENCOUNTER — Ambulatory Visit (INDEPENDENT_AMBULATORY_CARE_PROVIDER_SITE_OTHER): Payer: Medicare HMO | Admitting: Adult Health

## 2023-05-04 ENCOUNTER — Encounter: Payer: Self-pay | Admitting: Adult Health

## 2023-05-04 VITALS — BP 130/70 | HR 76 | Ht 68.0 in | Wt 185.8 lb

## 2023-05-04 DIAGNOSIS — G4733 Obstructive sleep apnea (adult) (pediatric): Secondary | ICD-10-CM

## 2023-05-04 NOTE — Patient Instructions (Addendum)
Continue to use CPAP At bedtime   Keep up good work  Order for new CPAP and supplies  Do not drive if sleep  Work on healthy weight.  Follow up in 1 year and As needed

## 2023-05-04 NOTE — Progress Notes (Unsigned)
@Patient  ID: Alexis Chan, male    DOB: 20-Mar-1951, 72 y.o.   MRN: [id number]  Chief Complaint  Patient presents with   Consult    Referring provider: Dana Allan, MD  HPI: 72 yo male seen for sleep consult 05/04/23 to reestablish for Sleep Apnea  Former patient of Dr. Craige Cotta  followed for severe OSA    TEST/EVENTS :  PSG 02/19/13 >> AHI 33.3, SpO2 low 78%, PLMI 20.  REM AHI 60, Supine AHI 50. Auto CPAP 05/25/18 to 08/22/18 >> used on 90 of 90 nights with average 8 hrs 10 min. Average AHI 3 with median CPAP 10 and 95 th percentile CPAP 13 cm H2O  05/04/2023 Sleep consult  Patient presents for a sleep consult to reestablish for sleep apnea.  Previously seen by Dr. Craige Cotta for severe obstructive sleep apnea on nocturnal CPAP last seen in the office in 2019.  Patient says he has been doing very well on CPAP.  He wears his CPAP every single night.  Cannot sleep without it.  He has been getting his supplies online.  He is recently got a message on his CPAP machine that the motor life is exceeded its life expectancy.  He needs an order for a new CPAP machine.  Typically goes to bed about 1130.  Only takes a couple minutes to go to sleep.  Gets up at 8:15 AM.  Previous sleep study in 2014 showed severe sleep apnea with a AHI of 33.3 and SpO2 low at 78%.  Patient says he feels he benefits from CPAP with decreased daytime sleepiness cannot sleep without it..  Patient does not take any sleep aids.  Occasionally takes a nap a couple times a week only for 30 minutes.  Takes in about 2 cups of caffeine daily.  No history of congestive heart failure or stroke.  Patient does not take any sleep aids.  Occasionally takes a nap a couple times a week only for 30 minutes.  Takes in about 2 cups of caffeine daily.  No history of congestive heart failure or stroke.  Weight is steady at 185 pounds with a BMI at 25.  No symptoms suspicious for cataplexy or sleep paralysis.  Epworth score is 2 out of 24.  Typically gets  sleepy if he sits down in the evening hours or after eating lunch. CPAP download shows 100% compliance.  Daily average usage at 8 hours.  Patient is on auto CPAP 5 to 15 cm H2O.  AHI 2.5/hour.  Daily average pressure at 12.9 cm H2O.  He is currently using a fullface mask.  And uses [employer] for his DME company   Social history patient is married.  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  Patient is a never smoker.  No alcohol or drug use.  Has no children.  Past Surgical History:  Procedure Laterality Date   COLONOSCOPY  2007   WISDOM TOOTH EXTRACTION       Allergies  Allergen Reactions   Orange Juice [Orange Oil]     Sore/ stiff in body.    Immunization History  Administered Date(s) Administered   Tdap 12/28/2012    Past Medical History:  Diagnosis Date   Allergy    Hx of dysplastic nevus 07/29/2020   Spinal mid back moderate removed at surgery   Hx of dysplastic nevus 07/29/2020   L spinal upper back moderate removed at surgery   OSA (obstructive sleep apnea) 01/30/2013   Seasonal allergies  Sleep apnea    wares a c-pap    Tobacco History: Social History   Tobacco Use  Smoking Status Never  Smokeless Tobacco Never   Counseling given: Not Answered   Outpatient Medications Prior to Visit  Medication Sig Dispense Refill   Cyanocobalamin (VITAMIN B-12 CR PO) Take 3,000 mg by mouth daily.     fluticasone (FLONASE) 50 MCG/ACT nasal spray PT NEEDS OFFICE VISIT.  PLACE 1 SPRAY INTO THE NOSE 2 TIMES DAILY AS NEEDED FOR RHINITIS. 16 g 0   metroNIDAZOLE (METROGEL) 0.75 % gel Apply to affected areas of face 1-2 times daily. 45 g 2   Misc Natural Products (OSTEO BI-FLEX JOINT SHIELD PO) Take by mouth.     Multiple Vitamins-Minerals (HM MULTIVITAMIN ADULT GUMMY PO) Take by mouth daily.     No facility-administered medications prior to visit.     Review of Systems:   Constitutional:   No  weight loss, night sweats,  Fevers, chills, fatigue, or   lassitude.  HEENT:   No headaches,  Difficulty swallowing,  Tooth/dental problems, or  Sore throat,                No sneezing, itching, ear ache, nasal congestion, post nasal drip,   CV:  No chest pain,  Orthopnea, PND, swelling in lower extremities, anasarca, dizziness, palpitations, syncope.   GI  No heartburn, indigestion, abdominal pain, nausea, vomiting, diarrhea, change in bowel habits, loss of appetite, bloody stools.   Resp: No shortness of breath with exertion or at rest.  No excess mucus, no productive cough,  No non-productive cough,  No coughing up of blood.  No change in color of mucus.  No wheezing.  No chest wall deformity  Skin: no rash or lesions.  GU: no dysuria, change in color of urine, no urgency or frequency.  No flank pain, no hematuria   MS:  No joint pain or swelling.  No decreased range of motion.  No back pain.    Physical Exam  BP 130/70 (BP Location: Left Arm, Patient Position: Sitting, Cuff Size: Normal)   Pulse 76   Ht 5\' 8"  (1.727 m)   Wt 185 lb 12.8 oz (84.3 kg)   SpO2 98%   BMI 28.25 kg/m   GEN: A/Ox3; pleasant , NAD, well nourished    HEENT:  [place]/AT,  NOSE-clear, THROAT-clear, no lesions, no postnasal drip or exudate noted. Class 2-3 MP airway   NECK:  Supple w/ fair ROM; no JVD; normal carotid impulses w/o bruits; no thyromegaly or nodules palpated; no lymphadenopathy.    RESP  Clear  P & A; w/o, wheezes/ rales/ or rhonchi. no accessory muscle use, no dullness to percussion  CARD:  RRR, no m/r/g, no peripheral edema, pulses intact, no cyanosis or clubbing.  GI:   Soft & nt; nml bowel sounds; no organomegaly or masses detected.   Musco: Warm bil, no deformities or joint swelling noted.   Neuro: alert, no focal deficits noted.    Skin: Warm, no lesions or rashes    Lab Results:    BNP No results found for: "BNP"  ProBNP No results found for: "PROBNP"  Imaging: No results found.  Administration History     None            No data to display          No results found for: "NITRICOXIDE"      Assessment & Plan:   No problem-specific Assessment & Plan notes found  for this encounter.     Rubye Oaks, NP 05/04/2023

## 2023-05-05 NOTE — Assessment & Plan Note (Signed)
Patient has severe sleep apnea that was diagnosed in 2014.  Has been very compliant with CPAP.  His machine is getting old and has a message that needs to be replaced.  CPAP download shows excellent compliance and control on CPAP. We will continue at current settings with auto CPAP 5 to 15 cm H2O.  Order for new CPAP has been sent to DME company  - discussed how weight can impact sleep and risk for sleep disordered breathing - discussed options to assist with weight loss: combination of diet modification, cardiovascular and strength training exercises   - had an extensive discussion regarding the adverse health consequences related to untreated sleep disordered breathing - specifically discussed the risks for hypertension, coronary artery disease, cardiac dysrhythmias, cerebrovascular disease, and diabetes - lifestyle modification discussed   - discussed how sleep disruption can increase risk of accidents, particularly when driving - safe driving practices were discussed   Plan Patient Instructions  Continue to use CPAP At bedtime   Keep up good work  Order for new CPAP and supplies  Do not drive if sleep  Work on healthy weight.  Follow up in 1 year and As needed

## 2023-05-09 ENCOUNTER — Telehealth: Payer: Self-pay

## 2023-05-09 NOTE — Telephone Encounter (Signed)
Pt returning your call to schedule colonoscopy.  ° °

## 2023-05-12 ENCOUNTER — Encounter: Payer: Self-pay | Admitting: *Deleted

## 2023-05-12 NOTE — Progress Notes (Signed)
Reviewed and agree with assessment/plan.   Coralyn Helling, MD [facility] Pulmonary/Critical Care 05/12/2023, 10:04 AM Pager:  [phone]

## 2023-05-20 ENCOUNTER — Encounter: Payer: Self-pay | Admitting: Family Medicine

## 2023-05-20 DIAGNOSIS — Z114 Encounter for screening for human immunodeficiency virus [HIV]: Secondary | ICD-10-CM | POA: Insufficient documentation

## 2023-05-20 DIAGNOSIS — Z13220 Encounter for screening for lipoid disorders: Secondary | ICD-10-CM | POA: Insufficient documentation

## 2023-05-20 DIAGNOSIS — E559 Vitamin D deficiency, unspecified: Secondary | ICD-10-CM | POA: Insufficient documentation

## 2023-05-20 DIAGNOSIS — L570 Actinic keratosis: Secondary | ICD-10-CM | POA: Insufficient documentation

## 2023-05-20 DIAGNOSIS — G4733 Obstructive sleep apnea (adult) (pediatric): Secondary | ICD-10-CM | POA: Insufficient documentation

## 2023-05-20 DIAGNOSIS — L719 Rosacea, unspecified: Secondary | ICD-10-CM | POA: Insufficient documentation

## 2023-05-20 DIAGNOSIS — R7309 Other abnormal glucose: Secondary | ICD-10-CM | POA: Insufficient documentation

## 2023-05-20 DIAGNOSIS — Z131 Encounter for screening for diabetes mellitus: Secondary | ICD-10-CM | POA: Insufficient documentation

## 2023-05-20 DIAGNOSIS — E538 Deficiency of other specified B group vitamins: Secondary | ICD-10-CM | POA: Insufficient documentation

## 2023-05-20 DIAGNOSIS — Z1211 Encounter for screening for malignant neoplasm of colon: Secondary | ICD-10-CM | POA: Insufficient documentation

## 2023-05-20 DIAGNOSIS — Z1159 Encounter for screening for other viral diseases: Secondary | ICD-10-CM | POA: Insufficient documentation

## 2023-05-20 NOTE — Assessment & Plan Note (Signed)
Chronic Yearly skin exams Follows with Dermatology

## 2023-05-20 NOTE — Assessment & Plan Note (Signed)
Chronic Metrogel as needed Follows with Dermatology

## 2023-05-20 NOTE — Assessment & Plan Note (Signed)
Slight increase in BMI Check A1c

## 2023-05-20 NOTE — Assessment & Plan Note (Signed)
Check fasting lipids 

## 2023-05-20 NOTE — Assessment & Plan Note (Signed)
History of colon polyps.  Asympomatic Last Colonoscopy 03/2016, recommended repeat in 3 years Referral sent for repeat colonoscopy

## 2023-05-20 NOTE — Assessment & Plan Note (Signed)
Check Vitamin B 12 level 

## 2023-05-20 NOTE — Assessment & Plan Note (Signed)
Check Vitamin D level 

## 2023-05-20 NOTE — Assessment & Plan Note (Signed)
Chronic Compliant with CPAP nightly Needing new device Referral sent to Pulmonology for evaluation

## 2023-06-01 ENCOUNTER — Telehealth: Payer: Self-pay | Admitting: Family Medicine

## 2023-06-01 DIAGNOSIS — Z1211 Encounter for screening for malignant neoplasm of colon: Secondary | ICD-10-CM

## 2023-06-01 NOTE — Telephone Encounter (Signed)
Patient called and stated that his referral for a colonoscopy should go to [facility], not [facility]

## 2023-06-03 NOTE — Addendum Note (Signed)
Addended by: Benedict Needy on: 06/03/2023 01:33 PM   Modules accepted: Orders

## 2023-06-03 NOTE — Telephone Encounter (Signed)
Referral re-entered for [facility], as pt requested.

## 2023-06-13 NOTE — Telephone Encounter (Signed)
Called and gave pt number to call and schedule.

## 2023-06-13 NOTE — Telephone Encounter (Signed)
Pt called stating he has not heard anything for the referral appointment

## 2023-06-15 ENCOUNTER — Ambulatory Visit (INDEPENDENT_AMBULATORY_CARE_PROVIDER_SITE_OTHER): Payer: Medicare HMO | Admitting: *Deleted

## 2023-06-15 VITALS — Ht 68.0 in | Wt 180.0 lb

## 2023-06-15 DIAGNOSIS — Z0000 Encounter for general adult medical examination without abnormal findings: Secondary | ICD-10-CM | POA: Diagnosis not present

## 2023-06-15 NOTE — Patient Instructions (Signed)
Mr. Parpart , Thank you for taking time to come for your Medicare Wellness Visit. I appreciate your ongoing commitment to your health goals. Please review the following plan we discussed and let me know if I can assist you in the future.   Referrals/Orders/Follow-Ups/Clinician Recommendations: Please update vaccines  This is a list of the screening recommended for you and due dates:  Health Maintenance  Topic Date Due   COVID-19 Vaccine (1) Never done   Colon Cancer Screening  03/26/2019   Flu Shot  Never done   Zoster (Shingles) Vaccine (1 of 2) 08/03/2023*   Pneumonia Vaccine (1 of 1 - PCV) 05/02/2024*   Medicare Annual Wellness Visit  06/14/2024   DTaP/Tdap/Td vaccine (3 - Td or Tdap) 05/02/2033   Hepatitis C Screening  Completed   HPV Vaccine  Aged Out  *Topic was postponed. The date shown is not the original due date.    Advanced directives: (Copy Requested) Please bring a copy of your health care power of attorney and living will to the office to be added to your chart at your convenience.  Next Medicare Annual Wellness Visit scheduled for next year: Yes 06/19/24 @ 10:15

## 2023-06-15 NOTE — Progress Notes (Signed)
Subjective:   Alexis Chan is a 72 y.o. male who presents for an Initial Medicare Annual Wellness Visit.  Visit Complete: Virtual  I connected with  [name] on 06/15/23 by a audio enabled telemedicine application and verified that I am speaking with the correct person using two identifiers.  Patient Location: Home  Provider Location: Office/Clinic  I discussed the limitations of evaluation and management by telemedicine. The patient expressed understanding and agreed to proceed.  Because this visit was a virtual/telehealth visit, some criteria may be missing or patient reported. Any vitals not documented were not able to be obtained and vitals that have been documented are patient reported.     Cardiac Risk Factors include: advanced age (>93men, >44 women)     Objective:    Today's Vitals   06/15/23 1429  Weight: 180 lb (81.6 kg)  Height: 5\' 8"  (1.727 m)   Body mass index is 27.37 kg/m.     06/15/2023    2:44 PM 03/11/2016    9:01 AM  Advanced Directives  Does Patient Have a Medical Advance Directive? Yes Yes  Type of Estate agent of [place];Living will Living will  Copy of Healthcare Power of Attorney in Chart? No - copy requested     Current Medications (verified) Outpatient Encounter Medications as of 06/15/2023  Medication Sig   cholecalciferol (VITAMIN D3) 25 MCG (1000 UNIT) tablet Take 1,000 Units by mouth daily.   fluticasone (FLONASE) 50 MCG/ACT nasal spray PT NEEDS OFFICE VISIT.  PLACE 1 SPRAY INTO THE NOSE 2 TIMES DAILY AS NEEDED FOR RHINITIS.   metroNIDAZOLE (METROGEL) 0.75 % gel Apply to affected areas of face 1-2 times daily.   Misc Natural Products (OSTEO BI-FLEX JOINT SHIELD PO) Take by mouth.   Multiple Vitamins-Minerals (HM MULTIVITAMIN ADULT GUMMY PO) Take by mouth daily.   Cyanocobalamin (VITAMIN B-12 CR PO) Take 3,000 mg by mouth daily. (Patient not taking: Reported on 06/15/2023)   No facility-administered encounter  medications on file as of 06/15/2023.    Allergies (verified) Orange juice Erskine Emery oil]   History: Past Medical History:  Diagnosis Date   Allergy    Hx of dysplastic nevus 07/29/2020   Spinal mid back moderate removed at surgery   Hx of dysplastic nevus 07/29/2020   L spinal upper back moderate removed at surgery   OSA (obstructive sleep apnea) 01/30/2013   Seasonal allergies    Sleep apnea    wares a c-pap   Past Surgical History:  Procedure Laterality Date   COLONOSCOPY  2007   WISDOM TOOTH EXTRACTION     Family History  Problem Relation Age of Onset   Colon cancer Paternal Aunt        ? 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), now has a Catering manager firm  Tobacco Use   Smoking status: Never   Smokeless tobacco: Never  Substance and  Sexual Activity   Alcohol use: No    Alcohol/week: 0.0 standard drinks of alcohol   Drug use: No   Sexual activity: Yes    Birth control/protection: None  Other Topics Concern   Not on file  Social History Narrative   Household-- pt and wife   Mother was 35   Social Determinants of Health   Financial Resource Strain: Low Risk  (06/15/2023)   Overall Financial Resource Strain (CARDIA)    Difficulty of Paying Living Expenses: Not hard at all  Food Insecurity: No Food Insecurity (06/15/2023)   Hunger Vital Sign    Worried About Running Out of Food in the Last Year: Never true    Ran Out of Food in the Last Year: Never true  Transportation Needs: No Transportation Needs (06/15/2023)   PRAPARE - Therapist, art (Medical): No    Lack of Transportation (Non-Medical): No  Physical Activity: Inactive (06/15/2023)   Exercise Vital Sign    Days of Exercise per Week: 0 days    Minutes of Exercise per Session: 0 min  Stress: No Stress Concern Present (06/15/2023)   [employer] of Occupational Health - Occupational Stress Questionnaire    Feeling of Stress : Not at all  Social Connections: Socially Integrated (06/15/2023)   Social Connection and Isolation Panel [NHANES]    Frequency of Communication with Friends and Family: More than three times a week    Frequency of Social Gatherings with Friends and Family: More than three times a week    Attends Religious Services: More than 4 times per year    Active Member of [employer] or Organizations: Yes    Attends Engineer, structural: More than 4 times per year    Marital Status: Married    Tobacco Counseling Counseling given: Not Answered   Clinical Intake:  Pre-visit preparation completed: Yes  Pain : No/denies pain     BMI - recorded: 27.37 Nutritional Status: BMI 25 -29 Overweight Nutritional Risks: None Diabetes: No  How often do you need to have someone help you when you read instructions, pamphlets, or other written materials from your doctor or pharmacy?: 1 - Never  Interpreter Needed?: No  Information entered by :: R. Zorawar Strollo LPN   Activities of Daily Living    06/15/2023    2:31 PM  In your present state of health, do you have any difficulty performing the following activities:  Hearing? 0  Vision? 0  Comment glasses  Difficulty concentrating or making decisions? 0  Walking or climbing stairs? 0  Dressing or bathing? 0  Doing errands, shopping? 0  Preparing Food and eating ? N  Using the Toilet? N  In the past six months, have you accidently leaked urine? N  Do you have problems with loss of bowel control? N  Managing your Medications? N  Managing your Finances? N  Housekeeping or managing  your Housekeeping? N    Patient Care Team: Dana Allan, MD as PCP - General (Family Medicine) Coralyn Helling, MD as Consulting Physician (Pulmonary Disease) Willeen Niece, MD (Dermatology) Armbruster, Willaim Rayas, MD as Consulting Physician (Gastroenterology)  Indicate any recent Medical Services you may have received from other than [facility] providers in the past year (date may be approximate).     Assessment:   This is a routine wellness examination for Shaune.  Hearing/Vision screen Hearing Screening - Comments:: No issues Vision Screening - Comments:: glasses   Goals Addressed  This Visit's Progress    Patient Stated       Wants to play more golf , exercise more       Depression Screen    06/15/2023    2:36 PM 05/03/2023   10:12 AM 11/29/2016   10:29 AM 11/12/2015    9:57 AM  PHQ 2/9 Scores  PHQ - 2 Score 0 0 0 0  PHQ- 9 Score 0 0      Fall Risk    06/15/2023    2:33 PM 05/03/2023   10:12 AM 11/29/2016   10:29 AM 11/12/2015    9:57 AM  Fall Risk   Falls in the past year? 0 1 No No  Number falls in past yr: 0 0    Injury with Fall? 0 0    Risk for fall due to : No Fall Risks No Fall Risks    Follow up Falls prevention discussed;Falls evaluation completed Falls evaluation completed      MEDICARE RISK AT HOME: Medicare Risk at Home Any stairs in or around the home?: Yes If so, are there any without handrails?: No Home free of loose throw rugs in walkways, pet beds, electrical cords, etc?: Yes Adequate lighting in your home to reduce risk of falls?: Yes Life alert?: No Use of a cane, walker or w/c?: No Grab bars in the bathroom?: No Shower chair or bench in shower?: Yes Elevated toilet seat or a handicapped toilet?: Yes      Cognitive Function:        06/15/2023    2:44 PM  6CIT Screen  What Year? 0 points  What month? 0 points  What time? 0 points  Count back from 20 0 points  Months in reverse 0 points  Repeat phrase 0 points  Total  Score 0 points    Immunizations Immunization History  Administered Date(s) Administered   Tdap 12/28/2012    TDAP status: Due, Education has been provided regarding the importance of this vaccine. Advised may receive this vaccine at local pharmacy or Health Dept. Aware to provide a copy of the vaccination record if obtained from local pharmacy or Health Dept. Verbalized acceptance and understanding.  Flu Vaccine status: Declined, Education has been provided regarding the importance of this vaccine but patient still declined. Advised may receive this vaccine at local pharmacy or Health Dept. Aware to provide a copy of the vaccination record if obtained from local pharmacy or Health Dept. Verbalized acceptance and understanding.  Pneumococcal vaccine status: Declined,  Education has been provided regarding the importance of this vaccine but patient still declined. Advised may receive this vaccine at local pharmacy or Health Dept. Aware to provide a copy of the vaccination record if obtained from local pharmacy or Health Dept. Verbalized acceptance and understanding.   Covid-19 vaccine status: Declined, Education has been provided regarding the importance of this vaccine but patient still declined. Advised may receive this vaccine at local pharmacy or Health Dept.or vaccine clinic. Aware to provide a copy of the vaccination record if obtained from local pharmacy or Health Dept. Verbalized acceptance and understanding.  Qualifies for Shingles Vaccine? Yes   Zostavax completed No   Shingrix Completed?: No.    Education has been provided regarding the importance of this vaccine. Patient has been advised to call insurance company to determine out of pocket expense if they have not yet received this vaccine. Advised may also receive vaccine at local pharmacy or Health Dept. Verbalized acceptance and understanding.  Screening Tests  Health Maintenance  Topic Date Due   Medicare Annual Wellness (AWV)   Never done   COVID-19 Vaccine (1) Never done   Colonoscopy  03/26/2019   DTaP/Tdap/Td (2 - Td or Tdap) 12/29/2022   INFLUENZA VACCINE  Never done   Zoster Vaccines- Shingrix (1 of 2) 08/03/2023 (Originally 04/26/1970)   Pneumonia Vaccine 53+ Years old (1 of 1 - PCV) 05/02/2024 (Originally 04/26/2018)   Hepatitis C Screening  Completed   HPV VACCINES  Aged Out    Health Maintenance  Health Maintenance Due  Topic Date Due   Medicare Annual Wellness (AWV)  Never done   COVID-19 Vaccine (1) Never done   Colonoscopy  03/26/2019   DTaP/Tdap/Td (2 - Td or Tdap) 12/29/2022   INFLUENZA VACCINE  Never done    Colorectal Cancer Screening  Patient is in the process of scheduling an appointment  Lung Cancer Screening: (Low Dose CT Chest recommended if Age 41-80 years, 20 pack-year currently smoking OR have quit w/in 15years.) does not qualify.     Additional Screening:  Hepatitis C Screening: does qualify; Completed 04/2023  Vision Screening: Recommended annual ophthalmology exams for early detection of glaucoma and other disorders of the eye. Is the patient up to date with their annual eye exam?  No  Who is the provider or what is the name of the office in which the patient attends annual eye exams? [facility] will call and schedule an appointment If pt is not established with a provider, would they like to be referred to a provider to establish care? No .   Dental Screening: Recommended annual dental exams for proper oral hygiene   Community Resource Referral / Chronic Care Management: CRR required this visit?  No   CCM required this visit?  No    Plan:     I have personally reviewed and noted the following in the patient's chart:   Medical and social history Use of alcohol, tobacco or illicit drugs  Current medications and supplements including opioid prescriptions. Patient is not currently taking opioid prescriptions. Functional ability and status Nutritional  status Physical activity Advanced directives List of other physicians Hospitalizations, surgeries, and ER visits in previous 12 months Vitals Screenings to include cognitive, depression, and falls Referrals and appointments  In addition, I have reviewed and discussed with patient certain preventive protocols, quality metrics, and best practice recommendations. A written personalized care plan for preventive services as well as general preventive health recommendations were provided to patient.     Sydell Axon, LPN   1/61/0960   After Visit Summary: (MyChart) Due to this being a telephonic visit, the after visit summary with patients personalized plan was offered to patient via MyChart   Nurse Notes: None

## 2023-06-27 ENCOUNTER — Encounter: Payer: Self-pay | Admitting: Gastroenterology

## 2023-08-09 ENCOUNTER — Ambulatory Visit (AMBULATORY_SURGERY_CENTER): Payer: Medicare HMO

## 2023-08-09 ENCOUNTER — Encounter: Payer: Self-pay | Admitting: Gastroenterology

## 2023-08-09 VITALS — Ht 68.0 in | Wt 182.0 lb

## 2023-08-09 DIAGNOSIS — Z860100 Personal history of colon polyps, unspecified: Secondary | ICD-10-CM

## 2023-08-09 MED ORDER — NA SULFATE-K SULFATE-MG SULF 17.5-3.13-1.6 GM/177ML PO SOLN
1.0000 | Freq: Once | ORAL | 0 refills | Status: AC
Start: 2023-08-09 — End: 2023-08-09

## 2023-08-09 NOTE — Progress Notes (Signed)

## 2023-08-30 ENCOUNTER — Encounter: Payer: Self-pay | Admitting: Gastroenterology

## 2023-08-30 ENCOUNTER — Ambulatory Visit: Payer: Medicare HMO | Admitting: Gastroenterology

## 2023-08-30 VITALS — BP 119/68 | HR 64 | Temp 98.6°F | Resp 15 | Ht 68.0 in | Wt 182.0 lb

## 2023-08-30 DIAGNOSIS — K648 Other hemorrhoids: Secondary | ICD-10-CM

## 2023-08-30 DIAGNOSIS — K5140 Inflammatory polyps of colon without complications: Secondary | ICD-10-CM

## 2023-08-30 DIAGNOSIS — D123 Benign neoplasm of transverse colon: Secondary | ICD-10-CM | POA: Diagnosis not present

## 2023-08-30 DIAGNOSIS — D122 Benign neoplasm of ascending colon: Secondary | ICD-10-CM

## 2023-08-30 DIAGNOSIS — D124 Benign neoplasm of descending colon: Secondary | ICD-10-CM

## 2023-08-30 DIAGNOSIS — D120 Benign neoplasm of cecum: Secondary | ICD-10-CM

## 2023-08-30 DIAGNOSIS — Z1211 Encounter for screening for malignant neoplasm of colon: Secondary | ICD-10-CM | POA: Diagnosis not present

## 2023-08-30 DIAGNOSIS — Z860100 Personal history of colon polyps, unspecified: Secondary | ICD-10-CM

## 2023-08-30 DIAGNOSIS — K6289 Other specified diseases of anus and rectum: Secondary | ICD-10-CM

## 2023-08-30 MED ORDER — SODIUM CHLORIDE 0.9 % IV SOLN: 500.0000 mL | Freq: Once | INTRAVENOUS | Status: DC

## 2023-08-30 NOTE — Progress Notes (Signed)
Pt's states no medical or surgical changes since previsit or office visit. 

## 2023-08-30 NOTE — Op Note (Signed)
[facility] Patient Name: Alexis Chan Procedure Date: 08/30/2023 10:23 AM MRN: [id number] Endoscopist: Viviann Spare P. Adela Lank , MD, [id number] Age: 72 Referring MD:  Date of Birth: 1951-05-23 Gender: Male Account #: [id number] Procedure:                Colonoscopy Indications:              High risk colon cancer surveillance: Personal                            history of colonic polyps - 7 adenomas removed                            03/2016 Medicines:                Monitored Anesthesia Care Procedure:                Pre-Anesthesia Assessment:                           - Prior to the procedure, a History and Physical                            was performed, and patient medications and                            allergies were reviewed. The patient's tolerance of                            previous anesthesia was also reviewed. The risks                            and benefits of the procedure and the sedation                            options and risks were discussed with the patient.                            All questions were answered, and informed consent                            was obtained. Prior Anticoagulants: The patient has                            taken no anticoagulant or antiplatelet agents. ASA                            Grade Assessment: III - A patient with severe                            systemic disease. After reviewing the risks and                            benefits, the patient was deemed in satisfactory  condition to undergo the procedure.                           After obtaining informed consent, the colonoscope                            was passed under direct vision. Throughout the                            procedure, the patient's blood pressure, pulse, and                            oxygen saturations were monitored continuously. The                            CF [id number] #[id number] was introduced through the anus                             and advanced to the the cecum, identified by                            appendiceal orifice and ileocecal valve. The                            colonoscopy was performed without difficulty. The                            patient tolerated the procedure well. The quality                            of the bowel preparation was good. The ileocecal                            valve, appendiceal orifice, and rectum were                            photographed. Scope In: 10:37:42 AM Scope Out: 11:02:19 AM Scope Withdrawal Time: 0 hours 19 minutes 57 seconds  Total Procedure Duration: 0 hours 24 minutes 37 seconds  Findings:                 The perianal and digital rectal examinations were                            normal.                           Three sessile polyps were found in the cecum. The                            polyps were 3 to 4 mm in size. These polyps were                            removed with a cold snare. Resection and retrieval  were complete.                           A 3 mm polyp was found in the ileocecal valve. The                            polyp was sessile. The polyp was removed with a                            cold snare. Resection and retrieval were complete.                           Two sessile polyps were found in the ascending                            colon. The polyps were 3 mm in size. These polyps                            were removed with a cold snare. Resection and                            retrieval were complete.                           Two sessile polyps were found in the transverse                            colon. The polyps were 2 to 3 mm in size. These                            polyps were removed with a cold snare. Resection                            and retrieval were complete.                           A 7 mm polyp was found in the transverse colon. The                             polyp was pedunculated. The polyp was removed with                            a hot snare. Resection and retrieval were complete.                           A 3 mm polyp was found in the descending colon. The                            polyp was sessile. The polyp was removed with a                            cold snare. Resection and retrieval were complete.  Internal hemorrhoids were found. The hemorrhoids                            were small.                           Anal papilla(e) were hypertrophied.                           The exam was otherwise without abnormality. Of                            note, retroflexed views were poor in the rectum due                            to restricted rectal vault. AO was normal, I                            thought a good photo of it was taken, but it was                            not. Complications:            No immediate complications. Estimated blood loss:                            Minimal. Estimated Blood Loss:     Estimated blood loss was minimal. Impression:               - Three 3 to 4 mm polyps in the cecum, removed with                            a cold snare. Resected and retrieved.                           - One 3 mm polyp at the ileocecal valve, removed                            with a cold snare. Resected and retrieved.                           - Two 3 mm polyps in the ascending colon, removed                            with a cold snare. Resected and retrieved.                           - Two 2 to 3 mm polyps in the transverse colon,                            removed with a cold snare. Resected and retrieved.                           - One 7 mm polyp in the transverse colon,  removed                            with a hot snare. Resected and retrieved.                           - One 3 mm polyp in the descending colon, removed                            with a cold snare. Resected and retrieved.                            - Internal hemorrhoids.                           - Anal papilla(e) were hypertrophied.                           - The examination was otherwise normal. Recommendation:           - Patient has a contact number available for                            emergencies. The signs and symptoms of potential                            delayed complications were discussed with the                            patient. Return to normal activities tomorrow.                            Written discharge instructions were provided to the                            patient.                           - Resume previous diet.                           - Continue present medications.                           - Await pathology results, repeat colonoscopy in 3                            YEARS Nimo Verastegui P. Waseem Suess, MD 08/30/2023 11:08:42 AM This report has been signed electronically.

## 2023-08-30 NOTE — Progress Notes (Signed)
[facility] History and Physical   Primary Care Physician:  Dana Allan, MD   Reason for Procedure:   History of colon polyps  Plan:    colonoscopy     HPI: Alexis Chan is a 72 y.o. male  here for colonoscopy surveillance - last exam 03/2016, 7 polyps removed, most adenomas.   Patient denies any bowel symptoms at this time. No family history of colon cancer known. Otherwise feels well without any cardiopulmonary symptoms.   I have discussed risks / benefits of anesthesia and endoscopic procedure with Alexis Chan and they wish to proceed with the exams as outlined today.    Past Medical History:  Diagnosis Date   Allergy    Hx of dysplastic nevus 07/29/2020   Spinal mid back moderate removed at surgery   Hx of dysplastic nevus 07/29/2020   L spinal upper back moderate removed at surgery   OSA (obstructive sleep apnea) 01/30/2013   Seasonal allergies    Sleep apnea    wares a c-pap    Past Surgical History:  Procedure Laterality Date   COLONOSCOPY  2007   WISDOM TOOTH EXTRACTION      Prior to Admission medications   Medication Sig Start Date End Date Taking? Authorizing Provider  fluticasone (FLONASE) 50 MCG/ACT nasal spray PT NEEDS OFFICE VISIT.  PLACE 1 SPRAY INTO THE NOSE 2 TIMES DAILY AS NEEDED FOR RHINITIS. 01/22/14  Yes Wanda Plump, MD  metroNIDAZOLE (METROGEL) 0.75 % gel Apply to affected areas of face 1-2 times daily. 02/16/22  Yes Willeen Niece, MD  cholecalciferol (VITAMIN D3) 25 MCG (1000 UNIT) tablet Take 1,000 Units by mouth daily.    [provider]  glucosamine-chondroitin 500-400 MG tablet Take 1 tablet by mouth 3 (three) times daily.    [provider]  Misc Natural Products (OSTEO BI-FLEX JOINT SHIELD PO) Take by mouth.    [provider]  Multiple Vitamins-Minerals (HM MULTIVITAMIN ADULT GUMMY PO) Take by mouth daily.    [provider]    Current Outpatient Medications  Medication Sig Dispense  Refill   fluticasone (FLONASE) 50 MCG/ACT nasal spray PT NEEDS OFFICE VISIT.  PLACE 1 SPRAY INTO THE NOSE 2 TIMES DAILY AS NEEDED FOR RHINITIS. 16 g 0   metroNIDAZOLE (METROGEL) 0.75 % gel Apply to affected areas of face 1-2 times daily. 45 g 2   cholecalciferol (VITAMIN D3) 25 MCG (1000 UNIT) tablet Take 1,000 Units by mouth daily.     glucosamine-chondroitin 500-400 MG tablet Take 1 tablet by mouth 3 (three) times daily.     Misc Natural Products (OSTEO BI-FLEX JOINT SHIELD PO) Take by mouth.     Multiple Vitamins-Minerals (HM MULTIVITAMIN ADULT GUMMY PO) Take by mouth daily.     Current Facility-Administered Medications  Medication Dose Route Frequency Provider Last Rate Last Admin   0.9 %  sodium chloride infusion  500 mL Intravenous Once Wilbern Pennypacker, Willaim Rayas, MD        Allergies as of 08/30/2023 - Review Complete 08/30/2023  Allergen Reaction Noted   Orange juice [orange oil]  05/03/2023    Family History  Problem Relation Age of Onset   Colon cancer Paternal Aunt        ? 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), now has a Catering manager firm  Tobacco Use   Smoking status: Never   Smokeless tobacco: Never  Substance and Sexual Activity   Alcohol use: No    Alcohol/week: 0.0 standard drinks of alcohol   Drug use: No   Sexual activity: Yes    Birth control/protection: None  Other Topics Concern   Not on file  Social History Narrative   Household-- pt and wife   Mother was 78   Social Determinants of Health   Financial  Resource Strain: Low Risk  (06/15/2023)   Overall Financial Resource Strain (CARDIA)    Difficulty of Paying Living Expenses: Not hard at all  Food Insecurity: No Food Insecurity (06/15/2023)   Hunger Vital Sign    Worried About Running Out of Food in the Last Year: Never true    Ran Out of Food in the Last Year: Never true  Transportation Needs: No Transportation Needs (06/15/2023)   PRAPARE - Administrator, Civil Service (Medical): No    Lack of Transportation (Non-Medical): No  Physical Activity: Inactive (06/15/2023)   Exercise Vital Sign    Days of Exercise per Week: 0 days    Minutes of Exercise per Session: 0 min  Stress: No Stress Concern Present (06/15/2023)   [employer] of Occupational Health - Occupational Stress Questionnaire    Feeling of Stress : Not at all  Social Connections: Socially Integrated (06/15/2023)   Social Connection and Isolation Panel [NHANES]    Frequency of Communication with Friends and Family: More than three times a week    Frequency of Social Gatherings with Friends and Family: More than three times a week    Attends Religious Services: More than 4 times per year    Active Member of [employer] or Organizations: Yes    Attends Engineer, structural: More than 4 times per year    Marital Status: Married  Catering manager Violence: Not At Risk (06/15/2023)   Humiliation, Afraid, Rape, and Kick questionnaire    Fear of Current or Ex-Partner: No    Emotionally Abused: No    Physically Abused: No    Sexually Abused: No    Review of Systems: All other review of systems negative except as mentioned in the HPI.  Physical Exam: Vital signs BP (!) 142/70   Pulse 65   Temp 98.6 F (37 C) (Temporal)   Ht 5\' 8"  (1.727 m)   Wt 182 lb (82.6 kg)   SpO2 97%   BMI 27.67 kg/m   General:   Alert,  Well-developed, pleasant and cooperative in NAD Lungs:  Clear throughout to auscultation.   Heart:  Regular rate and rhythm Abdomen:  Soft,  nontender and nondistended.   Neuro/Psych:  Alert and cooperative. Normal mood and affect. A and O x 3  Harlin Rain, MD [facility]

## 2023-08-30 NOTE — Progress Notes (Signed)
Sedate, gd SR, tolerated procedure well, VSS, report to RN 

## 2023-08-30 NOTE — Progress Notes (Signed)
Called to room to assist during endoscopic procedure.  Patient ID and intended procedure confirmed with present staff. Received instructions for my participation in the procedure from the performing physician.  

## 2023-08-30 NOTE — Patient Instructions (Signed)
-   Resume previous diet. - Continue present medications. - Await pathology results, repeat colonoscopy in 3 YEARS   YOU HAD AN ENDOSCOPIC PROCEDURE TODAY AT THE [facility]:   Refer to the procedure report that was given to you for any specific questions about what was found during the examination.  If the procedure report does not answer your questions, please call your gastroenterologist to clarify.  If you requested that your care partner not be given the details of your procedure findings, then the procedure report has been included in a sealed envelope for you to review at your convenience later.  YOU SHOULD EXPECT: Some feelings of bloating in the abdomen. Passage of more gas than usual.  Walking can help get rid of the air that was put into your GI tract during the procedure and reduce the bloating. If you had a lower endoscopy (such as a colonoscopy or flexible sigmoidoscopy) you may notice spotting of blood in your stool or on the toilet paper. If you underwent a bowel prep for your procedure, you may not have a normal bowel movement for a few days.  Please Note:  You might notice some irritation and congestion in your nose or some drainage.  This is from the oxygen used during your procedure.  There is no need for concern and it should clear up in a day or so.  SYMPTOMS TO REPORT IMMEDIATELY:  Following lower endoscopy (colonoscopy or flexible sigmoidoscopy):  Excessive amounts of blood in the stool  Significant tenderness or worsening of abdominal pains  Swelling of the abdomen that is new, acute  Fever of 100F or higher  For urgent or emergent issues, a gastroenterologist can be reached at any hour by calling [phone]. Do not use MyChart messaging for urgent concerns.    DIET:  We do recommend a small meal at first, but then you may proceed to your regular diet.  Drink plenty of fluids but you should avoid alcoholic beverages for 24 hours.  ACTIVITY:  You  should plan to take it easy for the rest of today and you should NOT DRIVE or use heavy machinery until tomorrow (because of the sedation medicines used during the test).    FOLLOW UP: Our staff will call the number listed on your records the next business day following your procedure.  We will call around 7:15- 8:00 am to check on you and address any questions or concerns that you may have regarding the information given to you following your procedure. If we do not reach you, we will leave a message.     If any biopsies were taken you will be contacted by phone or by letter within the next 1-3 weeks.  Please call us at [phone] if you have not heard about the biopsies in 3 weeks.    SIGNATURES/CONFIDENTIALITY: You and/or your care partner have signed paperwork which will be entered into your electronic medical record.  These signatures attest to the fact that that the information above on your After Visit Summary has been reviewed and is understood.  Full responsibility of the confidentiality of this discharge information lies with you and/or your care-partner.

## 2023-08-31 ENCOUNTER — Telehealth: Payer: Self-pay

## 2023-08-31 NOTE — Telephone Encounter (Signed)
  Follow up Call-     08/30/2023    9:53 AM  Call back number  Post procedure Call Back phone  # [phone]  Permission to leave phone message Yes     Patient questions:  Do you have a fever, pain , or abdominal swelling? No. Pain Score  0 *  Have you tolerated food without any problems? Yes.    Have you been able to return to your normal activities? Yes.    Do you have any questions about your discharge instructions: Diet   No. Medications  No. Follow up visit  No.  Do you have questions or concerns about your Care? No.  Actions: * If pain score is 4 or above: No action needed, pain <4.

## 2023-09-01 LAB — SURGICAL PATHOLOGY

## 2023-09-26 ENCOUNTER — Ambulatory Visit: Payer: Medicare HMO | Admitting: Dermatology

## 2023-09-26 VITALS — BP 131/67

## 2023-09-26 DIAGNOSIS — W908XXA Exposure to other nonionizing radiation, initial encounter: Secondary | ICD-10-CM | POA: Diagnosis not present

## 2023-09-26 DIAGNOSIS — L570 Actinic keratosis: Secondary | ICD-10-CM

## 2023-09-26 DIAGNOSIS — L821 Other seborrheic keratosis: Secondary | ICD-10-CM | POA: Diagnosis not present

## 2023-09-26 DIAGNOSIS — L814 Other melanin hyperpigmentation: Secondary | ICD-10-CM | POA: Diagnosis not present

## 2023-09-26 DIAGNOSIS — L578 Other skin changes due to chronic exposure to nonionizing radiation: Secondary | ICD-10-CM

## 2023-09-26 NOTE — Patient Instructions (Addendum)

## 2023-09-26 NOTE — Progress Notes (Signed)
   Follow-Up Visit   Subjective  Alexis Chan is a 73 y.o. male who presents for the following: Actinic keratosis.  The patient has spots, moles and lesions to be evaluated, some may be new or changing.    The following portions of the chart were reviewed this encounter and updated as appropriate: medications, allergies, medical history  Review of Systems:  No other skin or systemic complaints except as noted in HPI or Assessment and Plan.  Objective  Well appearing patient in no apparent distress; mood and affect are within normal limits.  A focused examination was performed of the following areas: Face, arms, hands  Relevant exam findings are noted in the Assessment and Plan.  L cheek x 1, R malar cheek x 1 Pink scaly macules.   Assessment & Plan   AK (ACTINIC KERATOSIS) L cheek x 1, R malar cheek x 1 Actinic keratoses are precancerous spots that appear secondary to cumulative UV radiation exposure/sun exposure over time. They are chronic with expected duration over 1 year. A portion of actinic keratoses will progress to squamous cell carcinoma of the skin. It is not possible to reliably predict which spots will progress to skin cancer and so treatment is recommended to prevent development of skin cancer.  Recommend daily broad spectrum sunscreen SPF 30+ to sun-exposed areas, reapply every 2 hours as needed.  Recommend staying in the shade or wearing long sleeves, sun glasses (UVA+UVB protection) and wide brim hats (4-inch brim around the entire circumference of the hat). Call for new or changing lesions. Destruction of lesion - L cheek x 1, R malar cheek x 1  Destruction method: cryotherapy   Informed consent: discussed and consent obtained   Lesion destroyed using liquid nitrogen: Yes   Region frozen until ice ball extended beyond lesion: Yes   Outcome: patient tolerated procedure well with no complications   Post-procedure details: wound care instructions given    Additional details:  Prior to procedure, discussed risks of blister formation, small wound, skin dyspigmentation, or rare scar following cryotherapy. Recommend Vaseline ointment to treated areas while healing.   ACTINIC DAMAGE - chronic, secondary to cumulative UV radiation exposure/sun exposure over time - diffuse scaly erythematous macules with underlying dyspigmentation - Recommend daily broad spectrum sunscreen SPF 30+ to sun-exposed areas, reapply every 2 hours as needed.  - Recommend staying in the shade or wearing long sleeves, sun glasses (UVA+UVB protection) and wide brim hats (4-inch brim around the entire circumference of the hat). - Call for new or changing lesions.   SEBORRHEIC KERATOSIS - Stuck-on, waxy, tan-brown papules face, arms  - Benign-appearing - Discussed benign etiology and prognosis. - Observe - Call for any changes  LENTIGINES Exam: scattered tan macules face, arms Due to sun exposure Treatment Plan: Benign-appearing, observe. Recommend daily broad spectrum sunscreen SPF 30+ to sun-exposed areas, reapply every 2 hours as needed.  Call for any changes   Return as scheduled, for TBSE.  IAndrea Kerns, CMA, am acting as scribe for Rexene Rattler, MD .   Documentation: I have reviewed the above documentation for accuracy and completeness, and I agree with the above.  Rexene Rattler, MD

## 2023-10-11 ENCOUNTER — Encounter: Payer: Self-pay | Admitting: Internal Medicine

## 2023-10-11 ENCOUNTER — Ambulatory Visit (INDEPENDENT_AMBULATORY_CARE_PROVIDER_SITE_OTHER): Payer: Medicare PPO | Admitting: Internal Medicine

## 2023-10-11 VITALS — BP 118/60 | HR 60 | Temp 98.7°F | Ht 68.0 in | Wt 185.0 lb

## 2023-10-11 DIAGNOSIS — J0140 Acute pansinusitis, unspecified: Secondary | ICD-10-CM | POA: Insufficient documentation

## 2023-10-11 MED ORDER — DOXYCYCLINE HYCLATE 100 MG PO TABS: 100.0000 mg | ORAL_TABLET | Freq: Two times a day (BID) | ORAL | 0 refills | Status: DC

## 2023-10-11 MED ORDER — BENZONATATE 200 MG PO CAPS: 200.0000 mg | ORAL_CAPSULE | Freq: Three times a day (TID) | ORAL | 0 refills | Status: DC | PRN

## 2023-10-11 NOTE — Assessment & Plan Note (Signed)
Sick going on 2 weeks Squeak in chest---so will give doxy for better coverage in case atypical Benzonatate Discussed OTC supportive care

## 2023-10-11 NOTE — Progress Notes (Signed)
Subjective:    Patient ID: Alexis Chan, male    DOB: Aug 27, 1951, 73 y.o.   MRN: [id number]  HPI Here due to respiratory infection  Started with illness 10-12 days ago Using OTC meds Started with nasal drip and congestion Sinus stuffiness Some cough--especially at night. Occasional phlegm--mostly white Low grade fever---some sweat last night No SOB Some sore throat--but better now No ear pain now---but stuffed  Mucinex-D---last night----hard to tell if it helped the cough Also aleve Tried chlor-trimetron  Current Outpatient Medications on File Prior to Visit  Medication Sig Dispense Refill   cholecalciferol (VITAMIN D3) 25 MCG (1000 UNIT) tablet Take 1,000 Units by mouth daily.     fluticasone (FLONASE) 50 MCG/ACT nasal spray PT NEEDS OFFICE VISIT.  PLACE 1 SPRAY INTO THE NOSE 2 TIMES DAILY AS NEEDED FOR RHINITIS. 16 g 0   glucosamine-chondroitin 500-400 MG tablet Take 1 tablet by mouth 3 (three) times daily.     metroNIDAZOLE (METROGEL) 0.75 % gel Apply to affected areas of face 1-2 times daily. 45 g 2   Multiple Vitamins-Minerals (HM MULTIVITAMIN ADULT GUMMY PO) Take by mouth daily.     No current facility-administered medications on file prior to visit.    Allergies  Allergen Reactions   Orange Juice [Orange Oil]     Sore/ stiff in body.    Past Medical History:  Diagnosis Date   Allergy    Hx of dysplastic nevus 07/29/2020   Spinal mid back moderate removed at surgery   Hx of dysplastic nevus 07/29/2020   L spinal upper back moderate removed at surgery   OSA (obstructive sleep apnea) 01/30/2013   Seasonal allergies    Sleep apnea    wares a c-pap    Past Surgical History:  Procedure Laterality Date   COLONOSCOPY  2007   WISDOM TOOTH EXTRACTION      Family History  Problem Relation Age of Onset   Colon cancer Paternal Aunt        ? 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), now has a Catering manager firm  Tobacco Use   Smoking status: Never   Smokeless tobacco: Never  Substance and Sexual Activity   Alcohol use: No    Alcohol/week: 0.0 standard drinks of alcohol   Drug use: No   Sexual activity: Yes    Birth control/protection: None  Other Topics Concern   Not on file  Social History Narrative   Household-- pt and wife   Mother was 78   Social Drivers of Corporate investment banker Strain: Low Risk  (06/15/2023)   Overall Financial Resource Strain (CARDIA)    Difficulty of Paying Living Expenses: Not hard at all  Food Insecurity: No Food Insecurity (06/15/2023)   Hunger Vital Sign    Worried About Running Out of Food in the Last Year: Never  true    Ran Out of Food in the Last Year: Never true  Transportation Needs: No Transportation Needs (06/15/2023)   PRAPARE - Administrator, Civil Service (Medical): No    Lack of Transportation (Non-Medical): No  Physical Activity: Inactive (06/15/2023)   Exercise Vital Sign    Days of Exercise per Week: 0 days    Minutes of Exercise per Session: 0 min  Stress: No Stress Concern Present (06/15/2023)   [employer] of Occupational Health - Occupational Stress Questionnaire    Feeling of Stress : Not at all  Social Connections: Socially Integrated (06/15/2023)   Social Connection and Isolation Panel [NHANES]    Frequency of Communication with Friends and Family: More than three times a week    Frequency of Social Gatherings with Friends and Family: More than three times a week    Attends Religious Services: More than 4 times  per year    Active Member of [employer] or Organizations: Yes    Attends Engineer, structural: More than 4 times per year    Marital Status: Married  Catering manager Violence: Not At Risk (06/15/2023)   Humiliation, Afraid, Rape, and Kick questionnaire    Fear of Current or Ex-Partner: No    Emotionally Abused: No    Physically Abused: No    Sexually Abused: No   Review of Systems No loss of smell or taste No N/V Eating okay    Objective:   Physical Exam Constitutional:      Appearance: Normal appearance.  HENT:     Head:     Comments: No sinus tenderness    Mouth/Throat:     Pharynx: No oropharyngeal exudate or posterior oropharyngeal erythema.  Pulmonary:     Effort: Pulmonary effort is normal.     Breath sounds: No wheezing or rales.     Comments: Slight expiratory squeak but not tight Musculoskeletal:     Cervical back: Neck supple.  Lymphadenopathy:     Cervical: No cervical adenopathy.  Neurological:     Mental Status: He is alert.            Assessment & Plan:

## 2023-10-19 DIAGNOSIS — G4733 Obstructive sleep apnea (adult) (pediatric): Secondary | ICD-10-CM | POA: Diagnosis not present

## 2023-11-18 DIAGNOSIS — G4733 Obstructive sleep apnea (adult) (pediatric): Secondary | ICD-10-CM | POA: Diagnosis not present

## 2023-12-16 ENCOUNTER — Telehealth: Payer: Self-pay | Admitting: Family Medicine

## 2023-12-16 NOTE — Telephone Encounter (Signed)
 Lm;  Dr Clent Ridges is leaving the practice and your appointment on 05/03/2024 needs to be rescheduled with another provider. Please call the office to reschedule your Transfer of Care to either Dr Charlann Lange, MD, Darleen Crocker or Kara Dies, NP.  E2C2 please schedule

## 2023-12-17 DIAGNOSIS — G4733 Obstructive sleep apnea (adult) (pediatric): Secondary | ICD-10-CM | POA: Diagnosis not present

## 2023-12-20 ENCOUNTER — Encounter: Payer: Self-pay | Admitting: Family Medicine

## 2023-12-20 ENCOUNTER — Ambulatory Visit (INDEPENDENT_AMBULATORY_CARE_PROVIDER_SITE_OTHER): Admitting: Family Medicine

## 2023-12-20 VITALS — BP 118/60 | HR 66 | Temp 98.5°F | Resp 20 | Ht 68.0 in | Wt 185.8 lb

## 2023-12-20 DIAGNOSIS — E538 Deficiency of other specified B group vitamins: Secondary | ICD-10-CM

## 2023-12-20 DIAGNOSIS — G8929 Other chronic pain: Secondary | ICD-10-CM

## 2023-12-20 DIAGNOSIS — Z13220 Encounter for screening for lipoid disorders: Secondary | ICD-10-CM

## 2023-12-20 DIAGNOSIS — R7309 Other abnormal glucose: Secondary | ICD-10-CM

## 2023-12-20 DIAGNOSIS — E559 Vitamin D deficiency, unspecified: Secondary | ICD-10-CM

## 2023-12-20 DIAGNOSIS — Z1329 Encounter for screening for other suspected endocrine disorder: Secondary | ICD-10-CM

## 2023-12-20 DIAGNOSIS — M79641 Pain in right hand: Secondary | ICD-10-CM

## 2023-12-20 NOTE — Patient Instructions (Addendum)
 It was a pleasure meeting you today. Thank you for allowing me to take part in your health care.  Our goals for today as we discussed include:  Schedule lab appointment.  Fast for 10 hours  Referral sent to [facility] for right hand.  Call to schedule appointment Dr Luis Abed, DO [address] [place], [place], [place] [address] Phone: [phone] Open ? Closes 5?PM   This is a list of the screening recommended for you and due dates:  Health Maintenance  Topic Date Due   COVID-19 Vaccine (1) Never done   Zoster (Shingles) Vaccine (1 of 2) Never done   Pneumonia Vaccine (1 of 1 - PCV) 05/02/2024*   Flu Shot  04/20/2024   Medicare Annual Wellness Visit  06/14/2024   Colon Cancer Screening  08/29/2026   DTaP/Tdap/Td vaccine (3 - Td or Tdap) 05/02/2033   Hepatitis C Screening  Completed   HPV Vaccine  Aged Out  *Topic was postponed. The date shown is not the original due date.       If you have any questions or concerns, please do not hesitate to call the office at [phone].  I look forward to our next visit and until then take care and stay safe.  Regards,   Dana Allan, MD   [facility]

## 2023-12-20 NOTE — Progress Notes (Signed)
 SUBJECTIVE:   Chief Complaint  Patient presents with   Hand Pain    Right hand and wrist when he grabs the door knob to turn there is a sharp pain,tingling and burning from knuckle to the wrist. This has been going on for 2 years.   HPI Presents to clinic for acute visit  Discussed the use of AI scribe software for clinical note transcription with the patient, who gave verbal consent to proceed.  History of Present Illness Alexis Chan is a 73 year old male who presents with a two-year history of intermittent right hand pain.  He has experienced intermittent right hand pain for two years, originating from the knuckle area and extending towards the wrist. The pain is described as a burning sensation, particularly noticeable when gripping and twisting objects like doorknobs. It is sporadic and can be triggered by certain movements.  The onset of the pain was associated with an activity involving stirring a bucket of soil two years ago. Since then, he has avoided similar activities by using equipment that does not require hand stirring. Being right-handed may contribute to the frequency of use and subsequent irritation.  No swelling of the joints is noted, but there is a tingling sensation without numbness. There is no weakness in the arm, and he maintains a good range of motion. The pain is described as a low burn, and he has not taken any medication for it.  The pain can be felt when pressure is applied to certain areas of the hand, but it is not severe. It is described as a brief sharpness lasting about thirty seconds when it occurs, with a low-level burning sensation persisting at other times.    PERTINENT PMH / PSH: As above  OBJECTIVE:  BP 118/60   Pulse 66   Temp 98.5 F (36.9 C)   Resp 20   Ht 5\' 8"  (1.727 m)   Wt 185 lb 12 oz (84.3 kg)   SpO2 99%   BMI 28.24 kg/m    Physical Exam Vitals reviewed.  Constitutional:      General: He is not in acute distress.     Appearance: Normal appearance. He is not ill-appearing, toxic-appearing or diaphoretic.  Eyes:     General:        Right eye: No discharge.        Left eye: No discharge.  Cardiovascular:     Rate and Rhythm: Normal rate.     Pulses: Normal pulses.  Pulmonary:     Effort: Pulmonary effort is normal.  Musculoskeletal:        General: No swelling, tenderness, deformity or signs of injury. Normal range of motion.     Right forearm: Normal.     Left forearm: Normal.     Right wrist: Normal.     Left wrist: Normal.     Right hand: Normal.     Left hand: Normal.     Cervical back: Normal range of motion.  Skin:    General: Skin is warm and dry.  Neurological:     Mental Status: He is alert and oriented to person, place, and time. Mental status is at baseline.  Psychiatric:        Mood and Affect: Mood normal.        Behavior: Behavior normal.        Thought Content: Thought content normal.        Judgment: Judgment normal.  06/15/2023    2:36 PM 05/03/2023   10:12 AM 11/29/2016   10:29 AM 11/12/2015    9:57 AM  Depression screen PHQ 2/9  Decreased Interest 0 0 0 0  Down, Depressed, Hopeless 0 0 0 0  PHQ - 2 Score 0 0 0 0  Altered sleeping 0 0    Tired, decreased energy 0 0    Change in appetite 0 0    Feeling bad or failure about yourself  0 0    Trouble concentrating 0 0    Moving slowly or fidgety/restless 0 0    Suicidal thoughts 0 0    PHQ-9 Score 0 0    Difficult doing work/chores Not difficult at all Not difficult at all        05/03/2023   10:12 AM  GAD 7 : Generalized Anxiety Score  Nervous, Anxious, on Edge 0  Control/stop worrying 0  Worry too much - different things 0  Trouble relaxing 0  Restless 0  Easily annoyed or irritable 0  Afraid - awful might happen 0  Total GAD 7 Score 0  Anxiety Difficulty Not difficult at all    ASSESSMENT/PLAN:  Chronic hand pain, right Assessment & Plan: Intermittent right hand pain for two years,  exacerbated by twisting motions like turning a doorknob. Described as a burning sensation with tingling, without numbness or weakness. Likely due to repetitive motion.  Differential diagnosis includes arthritic changes or carpal tunnel syndrome. No swelling or decreased sensation. Conservative management preferred. Discussed NSAIDs for inflammation and gabapentin for neuropathic pain. CBD mentioned as an alternative for inflammation, though not prescribed or endorsed. Referral to orthopedics for further evaluation, including potential imaging or injection therapy. MRI deferred until after orthopedic evaluation due to insurance requirements and need for conservative management first. - Trial of NSAIDs such as ibuprofen for inflammation control. - Consider gabapentin if neuropathic pain persists. Declined today - Order lab work to rule out metabolic causes such as electrolyte imbalances, thyroid issues, vitamin B12, and vitamin D deficiencies. No indication for xray today. - Refer to orthopedics for further evaluation and management, including potential imaging or injection therapy.  Orders: -     Comprehensive metabolic panel with GFR; Future -     CBC with Differential/Platelet; Future -     Ambulatory referral to Orthopedics  Lipid screening -     Lipid panel; Future  Vitamin D deficiency -     VITAMIN D 25 Hydroxy (Vit-D Deficiency, Fractures); Future  Vitamin B 12 deficiency -     Vitamin B12; Future  Abnormal glucose -     Hemoglobin A1c; Future  Thyroid disorder screening -     TSH; Future    PDMP reviewed  Return if symptoms worsen or fail to improve, for PCP.  Dana Allan, MD

## 2023-12-23 ENCOUNTER — Other Ambulatory Visit (INDEPENDENT_AMBULATORY_CARE_PROVIDER_SITE_OTHER)

## 2023-12-23 DIAGNOSIS — Z1329 Encounter for screening for other suspected endocrine disorder: Secondary | ICD-10-CM

## 2023-12-23 DIAGNOSIS — E538 Deficiency of other specified B group vitamins: Secondary | ICD-10-CM | POA: Diagnosis not present

## 2023-12-23 DIAGNOSIS — M79641 Pain in right hand: Secondary | ICD-10-CM

## 2023-12-23 DIAGNOSIS — R7309 Other abnormal glucose: Secondary | ICD-10-CM | POA: Diagnosis not present

## 2023-12-23 DIAGNOSIS — E559 Vitamin D deficiency, unspecified: Secondary | ICD-10-CM | POA: Diagnosis not present

## 2023-12-23 DIAGNOSIS — Z13220 Encounter for screening for lipoid disorders: Secondary | ICD-10-CM

## 2023-12-23 LAB — CBC WITH DIFFERENTIAL/PLATELET
Basophils Absolute: 0 10*3/uL (ref 0.0–0.1)
Basophils Relative: 0.3 % (ref 0.0–3.0)
Eosinophils Absolute: 0.2 10*3/uL (ref 0.0–0.7)
Eosinophils Relative: 2.5 % (ref 0.0–5.0)
HCT: 43.7 % (ref 39.0–52.0)
Hemoglobin: 14.9 g/dL (ref 13.0–17.0)
Lymphocytes Relative: 21 % (ref 12.0–46.0)
Lymphs Abs: 1.6 10*3/uL (ref 0.7–4.0)
MCHC: 34.2 g/dL (ref 30.0–36.0)
MCV: 91.4 fl (ref 78.0–100.0)
Monocytes Absolute: 1 10*3/uL (ref 0.1–1.0)
Monocytes Relative: 13.1 % — ABNORMAL HIGH (ref 3.0–12.0)
Neutro Abs: 4.8 10*3/uL (ref 1.4–7.7)
Neutrophils Relative %: 63.1 % (ref 43.0–77.0)
Platelets: 176 10*3/uL (ref 150.0–400.0)
RBC: 4.78 Mil/uL (ref 4.22–5.81)
RDW: 14.2 % (ref 11.5–15.5)
WBC: 7.5 10*3/uL (ref 4.0–10.5)

## 2023-12-23 LAB — COMPREHENSIVE METABOLIC PANEL WITH GFR
ALT: 24 U/L (ref 0–53)
AST: 24 U/L (ref 0–37)
Albumin: 4.3 g/dL (ref 3.5–5.2)
Alkaline Phosphatase: 103 U/L (ref 39–117)
BUN: 11 mg/dL (ref 6–23)
CO2: 28 meq/L (ref 19–32)
Calcium: 9.4 mg/dL (ref 8.4–10.5)
Chloride: 105 meq/L (ref 96–112)
Creatinine, Ser: 1.1 mg/dL (ref 0.40–1.50)
GFR: 67.94 mL/min (ref >60.00)
Glucose, Bld: 93 mg/dL (ref 70–99)
Potassium: 3.9 meq/L (ref 3.5–5.1)
Sodium: 142 meq/L (ref 135–145)
Total Bilirubin: 0.6 mg/dL (ref 0.2–1.2)
Total Protein: 7 g/dL (ref 6.0–8.3)

## 2023-12-23 LAB — LIPID PANEL
Cholesterol: 136 mg/dL (ref 0–200)
HDL: 36.7 mg/dL — ABNORMAL LOW (ref >39.00)
LDL Cholesterol: 80 mg/dL (ref 0–99)
NonHDL: 99.76
Total CHOL/HDL Ratio: 4
Triglycerides: 100 mg/dL (ref 0.0–149.0)
VLDL: 20 mg/dL (ref 0.0–40.0)

## 2023-12-23 LAB — VITAMIN D 25 HYDROXY (VIT D DEFICIENCY, FRACTURES): VITD: 38.83 ng/mL (ref 30.00–100.00)

## 2023-12-23 LAB — TSH: TSH: 2.34 u[IU]/mL (ref 0.35–5.50)

## 2023-12-23 LAB — HEMOGLOBIN A1C: Hgb A1c MFr Bld: 5.6 % (ref 4.6–6.5)

## 2023-12-23 LAB — VITAMIN B12: Vitamin B-12: 905 pg/mL (ref 211–911)

## 2023-12-25 ENCOUNTER — Encounter: Payer: Self-pay | Admitting: Family Medicine

## 2023-12-25 DIAGNOSIS — G8929 Other chronic pain: Secondary | ICD-10-CM | POA: Insufficient documentation

## 2023-12-25 NOTE — Assessment & Plan Note (Addendum)
 Intermittent right hand pain for two years, exacerbated by twisting motions like turning a doorknob. Described as a burning sensation with tingling, without numbness or weakness. Likely due to repetitive motion.  Differential diagnosis includes arthritic changes or carpal tunnel syndrome. No swelling or decreased sensation. Conservative management preferred. Discussed NSAIDs for inflammation and gabapentin for neuropathic pain. CBD mentioned as an alternative for inflammation, though not prescribed or endorsed. Referral to orthopedics for further evaluation, including potential imaging or injection therapy. MRI deferred until after orthopedic evaluation due to insurance requirements and need for conservative management first. - Trial of NSAIDs such as ibuprofen for inflammation control. - Consider gabapentin if neuropathic pain persists. Declined today - Order lab work to rule out metabolic causes such as electrolyte imbalances, thyroid issues, vitamin B12, and vitamin D deficiencies. No indication for xray today. - Refer to orthopedics for further evaluation and management, including potential imaging or injection therapy.

## 2023-12-29 DIAGNOSIS — M542 Cervicalgia: Secondary | ICD-10-CM | POA: Diagnosis not present

## 2023-12-29 DIAGNOSIS — M25531 Pain in right wrist: Secondary | ICD-10-CM | POA: Diagnosis not present

## 2024-01-17 DIAGNOSIS — G4733 Obstructive sleep apnea (adult) (pediatric): Secondary | ICD-10-CM | POA: Diagnosis not present

## 2024-01-24 DIAGNOSIS — G629 Polyneuropathy, unspecified: Secondary | ICD-10-CM | POA: Diagnosis not present

## 2024-01-24 DIAGNOSIS — G5603 Carpal tunnel syndrome, bilateral upper limbs: Secondary | ICD-10-CM | POA: Diagnosis not present

## 2024-01-31 DIAGNOSIS — M25531 Pain in right wrist: Secondary | ICD-10-CM | POA: Diagnosis not present

## 2024-02-16 DIAGNOSIS — G4733 Obstructive sleep apnea (adult) (pediatric): Secondary | ICD-10-CM | POA: Diagnosis not present

## 2024-02-23 ENCOUNTER — Ambulatory Visit: Payer: Self-pay | Admitting: Podiatry

## 2024-02-23 DIAGNOSIS — B353 Tinea pedis: Secondary | ICD-10-CM | POA: Diagnosis not present

## 2024-02-23 DIAGNOSIS — L600 Ingrowing nail: Secondary | ICD-10-CM | POA: Diagnosis not present

## 2024-02-23 MED ORDER — CLOTRIMAZOLE-BETAMETHASONE 1-0.05 % EX CREA: 1.0000 | TOPICAL_CREAM | Freq: Every day | CUTANEOUS | 0 refills | Status: DC

## 2024-02-23 NOTE — Progress Notes (Signed)
 Subjective:  Patient ID: Alexis Chan, male    DOB: [birth year],  MRN: [id number]  Chief Complaint  Patient presents with   Ingrown Toenail    Right hallux ingrown     73 y.o. male presents with the above complaint.  Patient presents with right hallux medial border ingrown painful to touch is progressive gotten worse worse with ambulation or shoe pressure patient would like to have it removed.  He also has secondary complaint of athlete's foot to the left heel.  When I get it evaluated has tried over-the-counter spray which has not helped.  There is some itching associated with it denies any other acute issues   Review of Systems: Negative except as noted in the HPI. Denies N/V/F/Ch.  Past Medical History:  Diagnosis Date   Allergy    Hx of dysplastic nevus 07/29/2020   Spinal mid back moderate removed at surgery   Hx of dysplastic nevus 07/29/2020   L spinal upper back moderate removed at surgery   OSA (obstructive sleep apnea) 01/30/2013   Seasonal allergies    Sleep apnea    wares a c-pap    Current Outpatient Medications:    clotrimazole-betamethasone (LOTRISONE) cream, Apply 1 Application topically daily., Disp: 30 g, Rfl: 0   fluticasone  (FLONASE ) 50 MCG/ACT nasal spray, PT NEEDS OFFICE VISIT.  PLACE 1 SPRAY INTO THE NOSE 2 TIMES DAILY AS NEEDED FOR RHINITIS., Disp: 16 g, Rfl: 0   glucosamine-chondroitin 500-400 MG tablet, Take 1 tablet by mouth 3 (three) times daily., Disp: , Rfl:    metroNIDAZOLE  (METROGEL ) 0.75 % gel, Apply to affected areas of face 1-2 times daily., Disp: 45 g, Rfl: 2   Multiple Vitamins-Minerals (HM MULTIVITAMIN ADULT GUMMY PO), Take by mouth daily., Disp: , Rfl:   Social History   Tobacco Use  Smoking Status Never  Smokeless Tobacco Never    Allergies  Allergen Reactions   Orange Juice [Orange Oil]     Sore/ stiff in body.   Objective:  There were no vitals filed for this visit. There is no height or weight on file to calculate  BMI. Constitutional Well developed. Well nourished.  Vascular Dorsalis pedis pulses palpable bilaterally. Posterior tibial pulses palpable bilaterally. Capillary refill normal to all digits.  No cyanosis or clubbing noted. Pedal hair growth normal.  Neurologic Normal speech. Oriented to person, place, and time. Epicritic sensation to light touch grossly present bilaterally.  Dermatologic Painful ingrowing nail at medial nail borders of the hallux nail right. No other open wounds. No skin lesions.  Orthopedic: Normal joint ROM without pain or crepitus bilaterally. No visible deformities. No bony tenderness.   Radiographs: None Assessment:   1. Athlete's foot, left   2. Ingrown toenail of right foot    Plan:  Patient was evaluated and treated and all questions answered.  Left athlete's foot - All questions and concerns were discussed with the patient extensive detail.  There were at least with this present patient would benefit from Lotrisone cream to be applied twice a day Lotrisone cream was sent to the pharmacy.  Ingrown Nail, right -Patient elects to proceed with minor surgery to remove ingrown toenail removal today. Consent reviewed and signed by patient. -Ingrown nail excised. See procedure note. -Educated on post-procedure care including soaking. Written instructions provided and reviewed. -Patient to follow up in 2 weeks for nail check.  Procedure: Excision of Ingrown Toenail Location: Right 1st toe medial nail borders. Anesthesia: Lidocaine 1% plain; 1.5 mL and Marcaine 0.5%  plain; 1.5 mL, digital block. Skin Prep: Betadine. Dressing: Silvadene; telfa; dry, sterile, compression dressing. Technique: Following skin prep, the toe was exsanguinated and a tourniquet was secured at the base of the toe. The affected nail border was freed, split with a nail splitter, and excised. Chemical matrixectomy was then performed with phenol and irrigated out with alcohol. The  tourniquet was then removed and sterile dressing applied. Disposition: Patient tolerated procedure well. Patient to return in 2 weeks for follow-up.   No follow-ups on file.

## 2024-02-23 NOTE — Patient Instructions (Signed)

## 2024-02-28 ENCOUNTER — Ambulatory Visit: Payer: Self-pay | Admitting: Podiatry

## 2024-03-18 DIAGNOSIS — G4733 Obstructive sleep apnea (adult) (pediatric): Secondary | ICD-10-CM | POA: Diagnosis not present

## 2024-03-21 ENCOUNTER — Ambulatory Visit (INDEPENDENT_AMBULATORY_CARE_PROVIDER_SITE_OTHER): Admitting: Dermatology

## 2024-03-21 ENCOUNTER — Encounter: Payer: Self-pay | Admitting: Dermatology

## 2024-03-21 DIAGNOSIS — L820 Inflamed seborrheic keratosis: Secondary | ICD-10-CM

## 2024-03-21 DIAGNOSIS — L905 Scar conditions and fibrosis of skin: Secondary | ICD-10-CM

## 2024-03-21 DIAGNOSIS — D2262 Melanocytic nevi of left upper limb, including shoulder: Secondary | ICD-10-CM

## 2024-03-21 DIAGNOSIS — D229 Melanocytic nevi, unspecified: Secondary | ICD-10-CM

## 2024-03-21 DIAGNOSIS — Z1283 Encounter for screening for malignant neoplasm of skin: Secondary | ICD-10-CM | POA: Diagnosis not present

## 2024-03-21 DIAGNOSIS — D2272 Melanocytic nevi of left lower limb, including hip: Secondary | ICD-10-CM

## 2024-03-21 DIAGNOSIS — Z872 Personal history of diseases of the skin and subcutaneous tissue: Secondary | ICD-10-CM

## 2024-03-21 DIAGNOSIS — L814 Other melanin hyperpigmentation: Secondary | ICD-10-CM

## 2024-03-21 DIAGNOSIS — Z86018 Personal history of other benign neoplasm: Secondary | ICD-10-CM

## 2024-03-21 DIAGNOSIS — L719 Rosacea, unspecified: Secondary | ICD-10-CM

## 2024-03-21 DIAGNOSIS — D2271 Melanocytic nevi of right lower limb, including hip: Secondary | ICD-10-CM

## 2024-03-21 DIAGNOSIS — W908XXA Exposure to other nonionizing radiation, initial encounter: Secondary | ICD-10-CM | POA: Diagnosis not present

## 2024-03-21 DIAGNOSIS — L821 Other seborrheic keratosis: Secondary | ICD-10-CM

## 2024-03-21 DIAGNOSIS — L578 Other skin changes due to chronic exposure to nonionizing radiation: Secondary | ICD-10-CM

## 2024-03-21 DIAGNOSIS — D225 Melanocytic nevi of trunk: Secondary | ICD-10-CM

## 2024-03-21 DIAGNOSIS — D1801 Hemangioma of skin and subcutaneous tissue: Secondary | ICD-10-CM

## 2024-03-21 MED ORDER — METRONIDAZOLE 0.75 % EX GEL: CUTANEOUS | 5 refills | Status: AC

## 2024-03-21 NOTE — Patient Instructions (Signed)

## 2024-03-21 NOTE — Progress Notes (Signed)
 Follow-Up Visit   Subjective  Alexis Chan is a 73 y.o. male who presents for the following: Skin Cancer Screening and Full Body Skin Exam  The patient presents for Total-Body Skin Exam (TBSE) for skin cancer screening and mole check. The patient has spots, moles and lesions to be evaluated, some may be new or changing. He has an itchy spot on the right lower cheek to check.    The following portions of the chart were reviewed this encounter and updated as appropriate: medications, allergies, medical history  Review of Systems:  No other skin or systemic complaints except as noted in HPI or Assessment and Plan.  Objective  Well appearing patient in no apparent distress; mood and affect are within normal limits.  A full examination was performed including scalp, head, eyes, ears, nose, lips, neck, chest, axillae, abdomen, back, buttocks, bilateral upper extremities, bilateral lower extremities, hands, feet, fingers, toes, fingernails, and toenails. All findings within normal limits unless otherwise noted below.   Relevant physical exam findings are noted in the Assessment and Plan.  R mandible x 1, R temple x 1 (2) Erythematous stuck-on, waxy papule  Assessment & Plan   SKIN CANCER SCREENING PERFORMED TODAY.  ACTINIC DAMAGE - Chronic condition, secondary to cumulative UV/sun exposure - diffuse scaly erythematous macules with underlying dyspigmentation - Recommend daily broad spectrum sunscreen SPF 30+ to sun-exposed areas, reapply every 2 hours as needed.  - Staying in the shade or wearing long sleeves, sun glasses (UVA+UVB protection) and wide brim hats (4-inch brim around the entire circumference of the hat) are also recommended for sun protection.  - Call for new or changing lesions.  LENTIGINES, SEBORRHEIC KERATOSES, HEMANGIOMAS - Benign normal skin lesions - Benign-appearing - Call for any changes  MELANOCYTIC NEVI - Tan-brown and/or pink-flesh-colored symmetric  macules and papules - Multiple nevi at feet - stable compared with photos from 07/2020 - 4 x 3 mm two toned brown macule slightly darker pigment central at left chest  - 6 x 4 mm thin brown papule darker medial on right lower back - 5 x 4 mm med brown thin papule at left wrist - Benign appearing on exam today - Observation - Call clinic for new or changing moles - Recommend daily use of broad spectrum spf 30+ sunscreen to sun-exposed areas.   Scars- lesions previously removed by [facility] dermatologist Exam: Thickened white plaques at L upper back and spinal mid lower back  Treatment Plan:  Benign, observe.    ROSACEA Exam Mid face erythema with telangiectasias; inflammatory papule on right neck.   Chronic and persistent condition with duration or expected duration over one year. Condition is improving with treatment but not currently at goal.   Rosacea is a chronic progressive skin condition usually affecting the face of adults, causing redness and/or acne bumps. It is treatable but not curable. It sometimes affects the eyes (ocular rosacea) as well. It may respond to topical and/or systemic medication and can flare with stress, sun exposure, alcohol, exercise, topical steroids (including hydrocortisone/cortisone 10) and some foods.  Daily application of broad spectrum spf 30+ sunscreen to face is recommended to reduce flares.  Patient denies grittiness of the eyes  Treatment Plan Continue metronidazole  0.75% gel 1-2 times daily 5Rf. Daily spf 30+ sunscreen qam  HISTORY OF DYSPLASTIC NEVI No evidence of recurrence today Recommend regular full body skin exams Recommend daily broad spectrum sunscreen SPF 30+ to sun-exposed areas, reapply every 2 hours as needed.  Call if  any new or changing lesions are noted between office visits  HISTORY OF PRECANCEROUS ACTINIC KERATOSIS - site(s) of PreCancerous Actinic Keratosis clear today. - these may recur and new lesions may form  requiring treatment to prevent transformation into skin cancer - observe for new or changing spots and contact [facility] for appointment if occur - photoprotection with sun protective clothing; sunglasses and broad spectrum sunscreen with SPF of at least 30 + and frequent self skin exams recommended - yearly exams by a dermatologist recommended for persons with history of PreCancerous Actinic Keratoses  INFLAMED SEBORRHEIC KERATOSIS (2) R mandible x 1, R temple x 1 (2) Symptomatic, irritating, patient would like treated. Destruction of lesion - R mandible x 1, R temple x 1 (2)  Destruction method: cryotherapy   Informed consent: discussed and consent obtained   Lesion destroyed using liquid nitrogen: Yes   Region frozen until ice ball extended beyond lesion: Yes   Outcome: patient tolerated procedure well with no complications   Post-procedure details: wound care instructions given   Additional details:  Prior to procedure, discussed risks of blister formation, small wound, skin dyspigmentation, or rare scar following cryotherapy. Recommend Vaseline ointment to treated areas while healing.   ROSACEA   Related Medications metroNIDAZOLE  (METROGEL ) 0.75 % gel Apply to affected areas of face 1-2 times daily for rosacea. Return in about 1 year (around 03/21/2025) for TBSE, Hx Dysplastic Nevus.  IAndrea Kerns, CMA, am acting as scribe for Rexene Rattler, MD .   Documentation: I have reviewed the above documentation for accuracy and completeness, and I agree with the above.  Rexene Rattler, MD

## 2024-03-26 ENCOUNTER — Encounter: Payer: Medicare HMO | Admitting: Dermatology

## 2024-05-01 ENCOUNTER — Ambulatory Visit (INDEPENDENT_AMBULATORY_CARE_PROVIDER_SITE_OTHER): Payer: Medicare HMO | Admitting: Adult Health

## 2024-05-01 ENCOUNTER — Encounter: Payer: Self-pay | Admitting: Adult Health

## 2024-05-01 VITALS — BP 118/58 | HR 66 | Ht 68.0 in | Wt 185.6 lb

## 2024-05-01 DIAGNOSIS — Z6828 Body mass index (BMI) 28.0-28.9, adult: Secondary | ICD-10-CM

## 2024-05-01 DIAGNOSIS — E669 Obesity, unspecified: Secondary | ICD-10-CM

## 2024-05-01 DIAGNOSIS — G4733 Obstructive sleep apnea (adult) (pediatric): Secondary | ICD-10-CM

## 2024-05-01 NOTE — Patient Instructions (Signed)
 Continue to use CPAP At bedtime   Keep up good work  Do not drive if sleep  Work on healthy weight.  Follow up in 1 year and As needed

## 2024-05-01 NOTE — Progress Notes (Signed)
 @Patient  ID: JAKWAN SALLY, male    DOB: 1950-11-11, 73 y.o.   MRN: [id number]  Chief Complaint  Patient presents with   Sleep Apnea    Referring provider: Hope Merle, MD  HPI: 73 yo male seen for sleep consult 05/04/23 to re-establish for Sleep apnea. Former patient of Dr. Shellia  followed for severe OSA  TEST/EVENTS :  PSG 02/19/13 >> AHI 33.3, SpO2 low 78%, PLMI 20.  REM AHI 60, Supine AHI 50. Auto CPAP 05/25/18 to 08/22/18 >> used on 90 of 90 nights with average 8 hrs 10 min. Average AHI 3 with median CPAP 10 and 95 th percentile CPAP 13 cm H2O  05/01/2024 Follow up ; OSA Discussed the use of AI scribe software for clinical note transcription with the patient, who gave verbal consent to proceed.  History of Present Illness RAHSHAWN REMO is a 73 year old male with sleep apnea who presents for a routine follow-up visit.  Patient says he is doing well on CPAP.  Wears a CPAP every single night.  Feels that he benefits from CPAP.  CPAP download shows excellent compliance with 100% usage.  Daily average usage at 8 hours.  Patient is on auto CPAP 5 to 15 cm H2O.  AHI 2.2/hour.  Minimal leaks.  [employer] is his DME.  He maintains an active lifestyle, although he has been less active recently due to paperwork. 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 He engages in daily scripture reading, which he finds beneficial. Current weight is 185 pounds with a BMI of 28.     Allergies  Allergen Reactions   Orange Juice [Orange Oil]     Sore/ stiff in body.    Immunization History  Administered Date(s) Administered   Tdap 12/28/2012, 05/03/2023    Past Medical History:  Diagnosis Date   Allergy    Hx of dysplastic nevus 07/29/2020   Spinal mid back moderate, bx   Hx of dysplastic nevus 07/29/2020   L spinal upper back moderate, bx   OSA (obstructive sleep apnea) 01/30/2013   Seasonal allergies    Sleep apnea    wares a c-pap    Tobacco  History: Social History   Tobacco Use  Smoking Status Never  Smokeless Tobacco Never   Counseling given: Not Answered   Outpatient Medications Prior to Visit  Medication Sig Dispense Refill   clotrimazole -betamethasone  (LOTRISONE ) cream Apply 1 Application topically daily. 30 g 0   fluticasone  (FLONASE ) 50 MCG/ACT nasal spray PT NEEDS OFFICE VISIT.  PLACE 1 SPRAY INTO THE NOSE 2 TIMES DAILY AS NEEDED FOR RHINITIS. 16 g 0   glucosamine-chondroitin 500-400 MG tablet Take 1 tablet by mouth 3 (three) times daily. (Patient taking differently: Take 2 tablets by mouth at bedtime.)     metroNIDAZOLE  (METROGEL ) 0.75 % gel Apply to affected areas of face 1-2 times daily for rosacea. 45 g 5   Multiple Vitamins-Minerals (HM MULTIVITAMIN ADULT GUMMY PO) Take by mouth daily.     No facility-administered medications prior to visit.     Review of Systems:   Constitutional:   No  weight loss, night sweats,  Fevers, chills, fatigue, or  lassitude.  HEENT:   No headaches,  Difficulty swallowing,  Tooth/dental problems, or  Sore throat,                No sneezing, itching, ear ache, nasal congestion, post nasal drip,   CV:  No  chest pain,  Orthopnea, PND, swelling in lower extremities, anasarca, dizziness, palpitations, syncope.   GI  No heartburn, indigestion, abdominal pain, nausea, vomiting, diarrhea, change in bowel habits, loss of appetite, bloody stools.   Resp: No shortness of breath with exertion or at rest.  No excess mucus, no productive cough,  No non-productive cough,  No coughing up of blood.  No change in color of mucus.  No wheezing.  No chest wall deformity  Skin: no rash or lesions.  GU: no dysuria, change in color of urine, no urgency or frequency.  No flank pain, no hematuria   MS:  No joint pain or swelling.  No decreased range of motion.  No back pain.    Physical Exam  BP (!) 118/58 (BP Location: Left Arm, Patient Position: Sitting, Cuff Size: Normal)   Pulse 66   Ht  5' 8 (1.727 m)   Wt 185 lb 9.6 oz (84.2 kg)   SpO2 97% Comment: RA  BMI 28.22 kg/m   GEN: A/Ox3; pleasant , NAD, well nourished    HEENT:  [place]/AT,  NOSE-clear, THROAT-clear, no lesions, no postnasal drip or exudate noted.   NECK:  Supple w/ fair ROM; no JVD; normal carotid impulses w/o bruits; no thyromegaly or nodules palpated; no lymphadenopathy.    RESP  Clear  P & A; w/o, wheezes/ rales/ or rhonchi. no accessory muscle use, no dullness to percussion  CARD:  RRR, no m/r/g, no peripheral edema, pulses intact, no cyanosis or clubbing.  GI:   Soft & nt; nml bowel sounds; no organomegaly or masses detected.   Musco: Warm bil, no deformities or joint swelling noted.   Neuro: alert, no focal deficits noted.    Skin: Warm, no lesions or rashes      BNP No results found for: BNP  ProBNP No results found for: PROBNP  Imaging: No results found.  Administration History     None           No data to display          No results found for: NITRICOXIDE      Assessment & Plan:   No problem-specific Assessment & Plan notes found for this encounter.  Assessment and Plan Assessment & Plan Obstructive sleep apnea on CPAP therapy   Obstructive sleep apnea is well-controlled with CPAP therapy. He demonstrates excellent compliance, using the machine 100% of the time for over eight hours most nights. The CPAP settings are optimal, resulting in minimal apneic events. He reports feeling rested and benefiting from CPAP. There are no issues with the CPAP machine or supply company, [employer] Continue with CPAP care.  Patient education given.  Schedule a follow-up in one year unless issues arise, in which case contact the office sooner.  Obesity with BMI 28.  Healthy weight loss discussed.  Plan  Patient Instructions  Continue to use CPAP At bedtime   Keep up good work  Do not drive if sleep  Work on healthy weight.  Follow up in 1 year and As needed        Madelin Stank, NP 05/01/2024

## 2024-05-03 ENCOUNTER — Encounter: Payer: Medicare HMO | Admitting: Family Medicine

## 2024-05-07 ENCOUNTER — Ambulatory Visit (INDEPENDENT_AMBULATORY_CARE_PROVIDER_SITE_OTHER): Admitting: Internal Medicine

## 2024-05-07 ENCOUNTER — Encounter: Payer: Self-pay | Admitting: Internal Medicine

## 2024-05-07 ENCOUNTER — Ambulatory Visit

## 2024-05-07 VITALS — BP 122/76 | HR 62 | Temp 97.5°F | Ht 68.0 in | Wt 180.6 lb

## 2024-05-07 DIAGNOSIS — Z0000 Encounter for general adult medical examination without abnormal findings: Secondary | ICD-10-CM

## 2024-05-07 DIAGNOSIS — R2232 Localized swelling, mass and lump, left upper limb: Secondary | ICD-10-CM | POA: Insufficient documentation

## 2024-05-07 NOTE — Patient Instructions (Signed)
-   It was a pleasure meeting you today -We will obtain an x-ray of your left hand to rule out a fracture -Please continue use Aleve as needed for the pain and swelling in your left hand -Your blood pressure is well-controlled.  Keep up the great work -Please continue to try to incorporate healthy diet exercise regimen into your daily routine -Please obtain your shingles vaccine from your pharmacy as this could help decrease recurrence of the shingles as well as the severity of an exacerbation -Please contact us  with any questions or concerns or if you need refills

## 2024-05-07 NOTE — Assessment & Plan Note (Signed)
-   Patient does not want a pneumonia vaccine -He is open to obtaining shingles vaccine and will obtain this from his pharmacy -He had recent lab work that was normal -He had a recent colonoscopy 2024 which showed multiple polyps.  He will need repeat colonoscopy in 2027 -No further workup at this time

## 2024-05-07 NOTE — Progress Notes (Addendum)
 Complete physical exam  Patient: Alexis Chan   DOB: 1950/10/20   73 y.o. Male  MRN: [id number]  Subjective:    Chief Complaint  Patient presents with   Annual Exam    Alexis Chan is a 73 y.o. male who presents today for a complete physical exam. He reports consuming a general diet. The patient has a physically strenuous job, but has no regular exercise apart from work.  He generally feels well. He reports sleeping well. He does have additional problems to discuss today.   Patient states that he fell approximately 4 days ago while walking his dog.  It was a mechanical fall as he was trying to remove a pebble from his shoe and was on a gravel driveway and lost his balance.  He complains of significant pain and swelling in his left hand for which using Aleve as needed.  He states that the swelling and pain is slowly improving.  He did have a recent colonoscopy in 2024 for which showed multiple sessile polyps throughout his colon.  Will need repeat colonoscopy in 3 years.  He states his rosacea is well-controlled on MetroGel .   Most recent fall risk assessment:    05/07/2024   10:19 AM  Fall Risk   Falls in the past year? 1  Number falls in past yr: 0  Injury with Fall? 1  Risk for fall due to : History of fall(s)  Follow up Falls evaluation completed     Most recent depression screenings:    05/07/2024   10:19 AM 06/15/2023    2:36 PM  PHQ 2/9 Scores  PHQ - 2 Score 0 0  PHQ- 9 Score 0 0        Patient Care Team: Pcp, No as PCP - General Shellia Oh, MD (Inactive) as Consulting Physician (Pulmonary Disease) Jackquline Sawyer, MD (Dermatology) Armbruster, Elspeth SQUIBB, MD as Consulting Physician (Gastroenterology)   Outpatient Medications Prior to Visit  Medication Sig   clotrimazole -betamethasone  (LOTRISONE ) cream Apply 1 Application topically daily.   fluticasone  (FLONASE ) 50 MCG/ACT nasal spray PT NEEDS OFFICE VISIT.  PLACE 1 SPRAY INTO THE NOSE 2 TIMES DAILY AS  NEEDED FOR RHINITIS.   glucosamine-chondroitin 500-400 MG tablet Take 2 tablets by mouth daily. Patient takes 2 tablets daily.   metroNIDAZOLE  (METROGEL ) 0.75 % gel Apply to affected areas of face 1-2 times daily for rosacea.   Multiple Vitamins-Minerals (HM MULTIVITAMIN ADULT GUMMY PO) Take by mouth daily.   No facility-administered medications prior to visit.    Review of Systems  Constitutional: Negative.   HENT: Negative.    Respiratory: Negative.    Cardiovascular: Negative.   Gastrointestinal: Negative.   Musculoskeletal:        Left hand pain and swelling status post fall 4 days ago  Neurological: Negative.   Psychiatric/Behavioral: Negative.            Objective:     BP 122/76   Pulse 62   Temp (!) 97.5 F (36.4 C)   Ht 5' 8 (1.727 m)   Wt 180 lb 9.6 oz (81.9 kg)   SpO2 96%   BMI 27.46 kg/m    Physical Exam Constitutional:      Appearance: Normal appearance.  HENT:     Head: Normocephalic and atraumatic.  Cardiovascular:     Rate and Rhythm: Normal rate and regular rhythm.     Heart sounds: Normal heart sounds.  Pulmonary:     Effort: Pulmonary effort is normal.  Breath sounds: Normal breath sounds. No wheezing, rhonchi or rales.  Abdominal:     General: Bowel sounds are normal. There is no distension.     Palpations: Abdomen is soft.     Tenderness: There is no abdominal tenderness. There is no guarding or rebound.     Comments: Ventral hernia noted on exam  Musculoskeletal:        General: Swelling present. No tenderness.     Right lower leg: No edema.     Left lower leg: No edema.     Comments: Patient with notable swelling over the lateral aspect of his left hand and his left little finger with no tenderness to palpation or increased local warmth.  He is able to make a fist but has some tightness in his left pinky when he does this  Neurological:     Mental Status: He is alert.  Psychiatric:        Mood and Affect: Mood normal.         Behavior: Behavior normal.      No results found for any visits on 05/07/24.     Assessment & Plan:    Routine Health Maintenance and Physical Exam  Immunization History  Administered Date(s) Administered   Tdap 12/28/2012, 05/03/2023    Health Maintenance  Topic Date Due   Zoster Vaccines- Shingrix (1 of 2) Never done   Pneumococcal Vaccine: 50+ Years (1 of 1 - PCV) Never done   Medicare Annual Wellness (AWV)  06/14/2024   INFLUENZA VACCINE  12/18/2024 (Originally 04/20/2024)   Colonoscopy  08/29/2026   DTaP/Tdap/Td (3 - Td or Tdap) 05/02/2033   Hepatitis C Screening  Completed   HPV VACCINES  Aged Out   Meningococcal B Vaccine  Aged Out   COVID-19 Vaccine  Discontinued    Discussed health benefits of physical activity, and encouraged him to engage in regular exercise appropriate for his age and condition.  Problem List Items Addressed This Visit       Other   Annual physical exam   - Patient does not want a pneumonia vaccine -He is open to obtaining shingles vaccine and will obtain this from his pharmacy -He had recent lab work that was normal -He had a recent colonoscopy 2024 which showed multiple polyps.  He will need repeat colonoscopy in 2027 -No further workup at this time      Localized swelling on left hand - Primary   - Patient had a fall approximately 4 days ago while walking his dog and trying to move a pebble from his shoe.  He fell on his left hand and had significant pain and swelling over the lateral aspect of his left hand including his left pinky -He has been taking Aleve as needed for this -On exam, he does have a noticeable swelling over the lateral aspect of his left hand as well as his left pinky but no tenderness to palpation or increased local warmth or erythema -Suspect this is likely secondary to bruising from the fall and should improve.  However, we will obtain an x-ray of his left hand to rule out an occult fracture -Continue with Aleve as  needed -No further workup at this time      Relevant Orders   DG Hand Complete Left   No follow-ups on file.     Alisia Vanengen, MD

## 2024-05-07 NOTE — Assessment & Plan Note (Signed)
-   Patient had a fall approximately 4 days ago while walking his dog and trying to move a pebble from his shoe.  He fell on his left hand and had significant pain and swelling over the lateral aspect of his left hand including his left pinky -He has been taking Aleve as needed for this -On exam, he does have a noticeable swelling over the lateral aspect of his left hand as well as his left pinky but no tenderness to palpation or increased local warmth or erythema -Suspect this is likely secondary to bruising from the fall and should improve.  However, we will obtain an x-ray of his left hand to rule out an occult fracture -Continue with Aleve as needed -No further workup at this time

## 2024-05-14 ENCOUNTER — Encounter (HOSPITAL_BASED_OUTPATIENT_CLINIC_OR_DEPARTMENT_OTHER): Payer: Self-pay

## 2024-05-14 ENCOUNTER — Ambulatory Visit: Payer: Self-pay | Admitting: Internal Medicine

## 2024-05-15 ENCOUNTER — Telehealth: Payer: Self-pay | Admitting: *Deleted

## 2024-05-15 ENCOUNTER — Ambulatory Visit (INDEPENDENT_AMBULATORY_CARE_PROVIDER_SITE_OTHER): Admitting: *Deleted

## 2024-05-15 VITALS — Ht 68.0 in | Wt 180.0 lb

## 2024-05-15 DIAGNOSIS — Z0000 Encounter for general adult medical examination without abnormal findings: Secondary | ICD-10-CM | POA: Diagnosis not present

## 2024-05-15 NOTE — Telephone Encounter (Signed)
 Performed AWV Patient stated that he saw Dr. Onesimo 05/07/24 and it was noted on my chart that he has a hernia. Patient wants to know if he needs to be referred to another doctor to have them check it out. Patient stated that he knows sometimes surgery is required. Patient stated that he is not having any pain from the hernia at this time.  Patient requested a call back.

## 2024-05-15 NOTE — Progress Notes (Signed)
 Subjective:   Alexis Chan is a 73 y.o. who presents for a Medicare Wellness preventive visit.  As a reminder, Annual Wellness Visits don't include a physical exam, and some assessments may be limited, especially if this visit is performed virtually. We may recommend an in-person follow-up visit with your provider if needed.  Visit Complete: Virtual I connected with  Alexis Chan on 05/15/24 by a audio enabled telemedicine application and verified that I am speaking with the correct person using two identifiers.  Patient Location: Home  Provider Location: Home Office  I discussed the limitations of evaluation and management by telemedicine. The patient expressed understanding and agreed to proceed.  Vital Signs: Because this visit was a virtual/telehealth visit, some criteria may be missing or patient reported. Any vitals not documented were not able to be obtained and vitals that have been documented are patient reported.  VideoDeclined- This patient declined Librarian, academic. Therefore the visit was completed with audio only.  Persons Participating in Visit: Patient.  AWV Questionnaire: No: Patient Medicare AWV questionnaire was not completed prior to this visit.  Cardiac Risk Factors include: advanced age (>79men, >29 women);male gender     Objective:    Today's Vitals   05/15/24 1533  Weight: 180 lb (81.6 kg)  Height: 5' 8 (1.727 m)  PainSc: 2    Body mass index is 27.37 kg/m.     05/15/2024    3:56 PM 06/15/2023    2:44 PM 03/11/2016    9:01 AM  Advanced Directives  Does Patient Have a Medical Advance Directive? Yes Yes Yes   Type of Estate agent of [place];Living will Healthcare Power of [place];Living will Living will   Copy of Healthcare Power of Attorney in Chart? No - copy requested No - copy requested      Data saved with a previous flowsheet row definition    Current Medications  (verified) Outpatient Encounter Medications as of 05/15/2024  Medication Sig   clotrimazole -betamethasone  (LOTRISONE ) cream Apply 1 Application topically daily. (Patient taking differently: Apply 1 Application topically daily as needed.)   fluticasone  (FLONASE ) 50 MCG/ACT nasal spray PT NEEDS OFFICE VISIT.  PLACE 1 SPRAY INTO THE NOSE 2 TIMES DAILY AS NEEDED FOR RHINITIS. (Patient taking differently: in the morning and at bedtime. PT NEEDS OFFICE VISIT.  PLACE 1 SPRAY INTO THE NOSE 2 TIMES DAILY AS NEEDED FOR RHINITIS.)   glucosamine-chondroitin 500-400 MG tablet Take 2 tablets by mouth daily. Patient takes 2 tablets daily.   metroNIDAZOLE  (METROGEL ) 0.75 % gel Apply to affected areas of face 1-2 times daily for rosacea.   Multiple Vitamins-Minerals (HM MULTIVITAMIN ADULT GUMMY PO) Take by mouth daily.   No facility-administered encounter medications on file as of 05/15/2024.    Allergies (verified) Orange juice monetta oil]   History: Past Medical History:  Diagnosis Date   Allergy    Hx of dysplastic nevus 07/29/2020   Spinal mid back moderate, bx   Hx of dysplastic nevus 07/29/2020   L spinal upper back moderate, bx   OSA (obstructive sleep apnea) 01/30/2013   Seasonal allergies    Sleep apnea    wares a c-pap   Past Surgical History:  Procedure Laterality Date   COLONOSCOPY  2007   WISDOM TOOTH EXTRACTION     Family History  Problem Relation Age of Onset   Colon cancer Paternal Aunt        ? dx in her 7's   CAD Neg  Hx    Prostate cancer Neg Hx    Stroke Neg Hx    Esophageal cancer Neg Hx    Pancreatic cancer Neg Hx    Rectal cancer Neg Hx    Stomach cancer Neg Hx    Colon polyps Neg Hx    Social History   Socioeconomic History   Marital status: Married    Spouse name: Not on file   Number of children: 0   Years of education: Not on file   Highest education level: Not on file  Occupational History   Occupation: retired from the [employer] state [employer], hospital), now has a Catering manager firm  Tobacco Use   Smoking status: Never   Smokeless tobacco: Never  Substance and Sexual Activity   Alcohol use: No    Alcohol/week: 0.0 standard drinks of alcohol   Drug use: No   Sexual activity: Yes    Birth control/protection: None  Other Topics Concern   Not on file  Social History Narrative   Household-- pt and wife   Mother was 17   Social Drivers of Corporate investment banker Strain: Low Risk  (05/15/2024)   Overall Financial Resource Strain (CARDIA)    Difficulty of Paying Living Expenses: Not hard at all  Food Insecurity: No Food Insecurity (05/15/2024)   Hunger Vital Sign    Worried About Running Out of Food in the Last Year: Never true    Ran Out of Food in the Last Year: Never true  Transportation Needs: No Transportation Needs (05/15/2024)   PRAPARE - Administrator, Civil Service (Medical): No    Lack of Transportation (Non-Medical): No  Physical Activity: Sufficiently Active (05/15/2024)   Exercise Vital Sign    Days of Exercise per Week: 2 days    Minutes of Exercise per Session: 150+ min  Stress: No Stress Concern Present (05/15/2024)   [employer] of Occupational Health - Occupational Stress Questionnaire    Feeling of Stress: Only a little  Social Connections: Moderately Integrated (05/15/2024)   Social Connection and Isolation Panel    Frequency of Communication with Friends and Family: More than three times a week    Frequency of Social Gatherings with Friends and Family: More than three times a week    Attends Religious Services: Never    Database administrator or Organizations: Yes    Attends Banker Meetings: Never    Marital Status: Married    Tobacco Counseling Counseling given: Not Answered    Clinical Intake:  Pre-visit preparation completed: Yes  Pain : 0-10 Pain Score: 2  Pain Type: Acute pain Pain Location: Hand Pain Orientation: Left Pain Descriptors /  Indicators: Sore Pain Onset: 1 to 4 weeks ago Pain Frequency: Intermittent     BMI - recorded: 27.37 Nutritional Status: BMI 25 -29 Overweight Nutritional Risks: None Diabetes: No  Lab Results  Component Value Date   HGBA1C 5.6 12/23/2023   HGBA1C 5.6 05/03/2023     How often do you need to have someone help you when you read instructions, pamphlets, or other written materials from your doctor or pharmacy?: 1 - Never  Interpreter Needed?: No  Information entered by :: R, Persephone Schriever LPN   Activities of Daily Living     05/15/2024    3:38 PM 06/15/2023    2:31 PM  In your present state of health, do you have any difficulty performing the following activities:  Hearing? 0 0  Vision?  0 0  Comment  glasses  Difficulty concentrating or making decisions? 0 0  Walking or climbing stairs? 0 0  Dressing or bathing? 0 0  Doing errands, shopping? 0 0  Preparing Food and eating ? N N  Using the Toilet? N N  In the past six months, have you accidently leaked urine? N N  Do you have problems with loss of bowel control? N N  Managing your Medications? N N  Managing your Finances? N N  Housekeeping or managing your Housekeeping? N N    Patient Care Team: Pcp, No as PCP - General Shellia Oh, MD (Inactive) as Consulting Physician (Pulmonary Disease) Jackquline Sawyer, MD (Dermatology) Armbruster, Elspeth SQUIBB, MD as Consulting Physician (Gastroenterology)  I have updated your Care Teams any recent Medical Services you may have received from other providers in the past year.     Assessment:   This is a routine wellness examination for Alexis Chan.  Hearing/Vision screen Hearing Screening - Comments:: No issues Vision Screening - Comments:: gkasses   Goals Addressed             This Visit's Progress    Patient Stated       Wants to try to start lifting weights       Depression Screen     05/15/2024    3:50 PM 05/07/2024   10:19 AM 06/15/2023    2:36 PM 05/03/2023   10:12 AM  11/29/2016   10:29 AM 11/12/2015    9:57 AM  PHQ 2/9 Scores  PHQ - 2 Score 0 0 0 0 0 0  PHQ- 9 Score 0 0 0 0      Fall Risk     05/15/2024    3:40 PM 05/07/2024   10:19 AM 06/15/2023    2:33 PM 05/03/2023   10:12 AM 11/29/2016   10:29 AM  Fall Risk   Falls in the past year? 1 1 0 1 No   Number falls in past yr: 0 0 0 0   Injury with Fall? 1 1 0 0   Risk for fall due to : History of fall(s);Impaired balance/gait History of fall(s) No Fall Risks No Fall Risks   Risk for fall due to: Comment walking dog, got a pebble in his shoe      Follow up Falls evaluation completed;Falls prevention discussed Falls evaluation completed Falls prevention discussed;Falls evaluation completed Falls evaluation completed      Data saved with a previous flowsheet row definition    MEDICARE RISK AT HOME:  Medicare Risk at Home Any stairs in or around the home?: Yes If so, are there any without handrails?: No Home free of loose throw rugs in walkways, pet beds, electrical cords, etc?: Yes Adequate lighting in your home to reduce risk of falls?: Yes Life alert?: No Use of a cane, walker or w/c?: No Grab bars in the bathroom?: No Shower chair or bench in shower?: Yes Elevated toilet seat or a handicapped toilet?: Yes  TIMED UP AND GO:  Was the test performed?  No  Cognitive Function: 6CIT completed        05/15/2024    3:58 PM 06/15/2023    2:44 PM  6CIT Screen  What Year? 0 points 0 points  What month? 0 points 0 points  What time? 0 points 0 points  Count back from 20 0 points 0 points  Months in reverse 0 points 0 points  Repeat phrase 2 points 0 points  Total Score  2 points 0 points    Immunizations Immunization History  Administered Date(s) Administered   Tdap 12/28/2012, 05/03/2023    Screening Tests Health Maintenance  Topic Date Due   Zoster Vaccines- Shingrix (1 of 2) Never done   Pneumococcal Vaccine: 50+ Years (1 of 1 - PCV) Never done   INFLUENZA VACCINE  12/18/2024  (Originally 04/20/2024)   Medicare Annual Wellness (AWV)  05/15/2025   Colonoscopy  08/29/2026   DTaP/Tdap/Td (3 - Td or Tdap) 05/02/2033   Hepatitis C Screening  Completed   HPV VACCINES  Aged Out   Meningococcal B Vaccine  Aged Out   COVID-19 Vaccine  Discontinued    Health Maintenance  Health Maintenance Due  Topic Date Due   Zoster Vaccines- Shingrix (1 of 2) Never done   Pneumococcal Vaccine: 50+ Years (1 of 1 - PCV) Never done   Health Maintenance Items Addressed: Patient declines vaccines.   Additional Screening:  Vision Screening: Recommended annual ophthalmology exams for early detection of glaucoma and other disorders of the eye. Overdue  [facility]  Patient was advised that he needs to call and schedule an eye appointment.  Would you like a referral to an eye doctor? No    Dental Screening: Recommended annual dental exams for proper oral hygiene  Community Resource Referral / Chronic Care Management: CRR required this visit?  No   CCM required this visit?  No   Plan:    I have personally reviewed and noted the following in the patient's chart:   Medical and social history Use of alcohol, tobacco or illicit drugs  Current medications and supplements including opioid prescriptions. Patient is not currently taking opioid prescriptions. Functional ability and status Nutritional status Physical activity Advanced directives List of other physicians Hospitalizations, surgeries, and ER visits in previous 12 months Vitals Screenings to include cognitive, depression, and falls Referrals and appointments  In addition, I have reviewed and discussed with patient certain preventive protocols, quality metrics, and best practice recommendations. A written personalized care plan for preventive services as well as general preventive health recommendations were provided to patient.   Angeline Fredericks, LPN   1/[id number]   After Visit Summary: (MyChart) Due to this being a  telephonic visit, the after visit summary with patients personalized plan was offered to patient via MyChart   Notes: Nothing significant to report at this time.

## 2024-05-15 NOTE — Patient Instructions (Signed)
 Mr. Stelle , Thank you for taking time out of your busy schedule to complete your Annual Wellness Visit with me. I enjoyed our conversation and look forward to speaking with you again next year. I, as well as your care team,  appreciate your ongoing commitment to your health goals. Please review the following plan we discussed and let me know if I can assist you in the future. Your Game plan/ To Do List    Referrals: If you haven't heard from the office you've been referred to, please reach out to them at the phone provided.  Consider updating your vaccines Follow up Visits: We will see or speak with you next year for your Next Medicare AWV with our clinical staff 05/20/25 @ 10:50 Have you seen your provider in the last 6 months (3 months if uncontrolled diabetes)? Yes  Clinician Recommendations:  Aim for 30 minutes of exercise or brisk walking, 6-8 glasses of water, and 5 servings of fruits and vegetables each day.       This is a list of the screenings recommended for you:  Health Maintenance  Topic Date Due   Zoster (Shingles) Vaccine (1 of 2) Never done   Pneumococcal Vaccine for age over 30 (1 of 1 - PCV) Never done   Flu Shot  12/18/2024*   Medicare Annual Wellness Visit  05/15/2025   Colon Cancer Screening  08/29/2026   DTaP/Tdap/Td vaccine (3 - Td or Tdap) 05/02/2033   Hepatitis C Screening  Completed   HPV Vaccine  Aged Out   Meningitis B Vaccine  Aged Out   COVID-19 Vaccine  Discontinued  *Topic was postponed. The date shown is not the original due date.    Advanced directives: (Copy Requested) Please bring a copy of your health care power of attorney and living will to the office to be added to your chart at your convenience. You can mail to [facility] [address] [place], [place] [address] or email to [email or web address] Advance Care Planning is important because it:  [x]  Makes sure you receive the medical care that is consistent with your  values, goals, and preferences  [x]  It provides guidance to your family and loved ones and reduces their decisional burden about whether or not they are making the right decisions based on your wishes.  F

## 2024-05-16 ENCOUNTER — Other Ambulatory Visit: Payer: Self-pay | Admitting: Internal Medicine

## 2024-05-16 DIAGNOSIS — K429 Umbilical hernia without obstruction or gangrene: Secondary | ICD-10-CM | POA: Insufficient documentation

## 2024-05-16 DIAGNOSIS — K439 Ventral hernia without obstruction or gangrene: Secondary | ICD-10-CM | POA: Insufficient documentation

## 2024-05-16 NOTE — Telephone Encounter (Signed)
 Left message to call the office back so I can pass Dr. Yates message and let him know that Dr. Onesimo will refer him to surgery so they can evaluate the hernia.

## 2024-05-16 NOTE — Assessment & Plan Note (Signed)
-   Patient had a ventral hernia noted incidentally on exam -Hernia was completely reducible -Patient wanted referral to general surgery for further evaluation of this and to see if surgery is required -Referral to surgery placed

## 2024-05-17 NOTE — Telephone Encounter (Signed)
 Called pt and informed him of this information. He said he would be waiting for the call.

## 2024-05-25 ENCOUNTER — Telehealth: Payer: Self-pay

## 2024-05-25 NOTE — Telephone Encounter (Signed)
 Copied from CRM #[id number]. Topic: Referral - Request for Referral >> May 25, 2024  1:51 PM Paige D wrote: Did the patient discuss referral with their provider in the last year? Yes  (If No - schedule appointment) (If Yes - send message)  Appointment offered? Yes  Type of order/referral and detailed reason for visit: Hernia   Preference of office, provider, location: Dr. Chandler Lace  Fax: [phone]   If referral order, have you been seen by this specialty before? No (If Yes, this issue or another issue? When? Where?  Can we respond through MyChart? Yes

## 2024-05-30 NOTE — Telephone Encounter (Signed)
 This message being sent to Dr Onesimo. It looks like he put the referral in.     Copied from CRM #[id number]. Topic: General - Call Back - No Documentation >> May 30, 2024 11:33 AM Alexis Chan wrote: Reason for CRM: Patient called back to check in on status of referral to the provider he prefers for surgery. I advised patient that it is still in progress of being switched over to the clinic that he prefers, from the note on 09/08. And, I gave the TT of 3-5 bds for response.    [phone] (M)

## 2024-05-30 NOTE — Telephone Encounter (Signed)
 Called pt and informed him of this and he stated that would call them to get scheduled.

## 2024-06-12 ENCOUNTER — Ambulatory Visit: Admitting: General Surgery

## 2024-06-19 ENCOUNTER — Ambulatory Visit: Payer: Medicare HMO

## 2024-06-20 ENCOUNTER — Ambulatory Visit (INDEPENDENT_AMBULATORY_CARE_PROVIDER_SITE_OTHER)

## 2024-06-20 VITALS — BP 120/70 | HR 64 | Temp 98.9°F | Ht 68.0 in | Wt 182.8 lb

## 2024-06-20 DIAGNOSIS — M6208 Separation of muscle (nontraumatic), other site: Secondary | ICD-10-CM | POA: Diagnosis not present

## 2024-06-20 DIAGNOSIS — R748 Abnormal levels of other serum enzymes: Secondary | ICD-10-CM | POA: Insufficient documentation

## 2024-06-20 DIAGNOSIS — Z1329 Encounter for screening for other suspected endocrine disorder: Secondary | ICD-10-CM

## 2024-06-20 DIAGNOSIS — K429 Umbilical hernia without obstruction or gangrene: Secondary | ICD-10-CM

## 2024-06-20 DIAGNOSIS — N182 Chronic kidney disease, stage 2 (mild): Secondary | ICD-10-CM | POA: Insufficient documentation

## 2024-06-20 DIAGNOSIS — Z13220 Encounter for screening for lipoid disorders: Secondary | ICD-10-CM

## 2024-06-20 DIAGNOSIS — R7989 Other specified abnormal findings of blood chemistry: Secondary | ICD-10-CM | POA: Insufficient documentation

## 2024-06-20 DIAGNOSIS — G8929 Other chronic pain: Secondary | ICD-10-CM

## 2024-06-20 DIAGNOSIS — J310 Chronic rhinitis: Secondary | ICD-10-CM | POA: Insufficient documentation

## 2024-06-20 DIAGNOSIS — R7309 Other abnormal glucose: Secondary | ICD-10-CM

## 2024-06-20 DIAGNOSIS — E663 Overweight: Secondary | ICD-10-CM | POA: Insufficient documentation

## 2024-06-20 DIAGNOSIS — Z131 Encounter for screening for diabetes mellitus: Secondary | ICD-10-CM

## 2024-06-20 DIAGNOSIS — G4733 Obstructive sleep apnea (adult) (pediatric): Secondary | ICD-10-CM

## 2024-06-20 NOTE — Assessment & Plan Note (Signed)
 Patient was evaluated by general surgeon at [facility] in 05/2024 for this. He has umbilical hernia, uncomplicated and was recommended non-operative management. Discussed red flag symptoms with the patient including abdominal pain, nausea, vomiting to seek medication care emergently.

## 2024-06-20 NOTE — Assessment & Plan Note (Signed)
 Patient stays active, eats balanced diet. BP within goal. Continue healthy lifestyle.

## 2024-06-20 NOTE — Assessment & Plan Note (Signed)
 Has a h/o mildly low HDL and normal LDL. Recommend continue to monitor lipid function intermittently. Future lipid panel ordered, repeat in 6 months.

## 2024-06-20 NOTE — Assessment & Plan Note (Signed)
 Patient was evaluated by general surgeon at [facility] in 05/2024 for this. He has umbilical hernia, uncomplicated and not a candidate for surgery. Patient asymptomatic. Requesting physical therapy referral for rectus abdominis muscle strengthening exercise. Referral made. If he prefers a different physical therapist he will reach out to us  and we will put in external referral for this.

## 2024-06-20 NOTE — Assessment & Plan Note (Signed)
 Elevated monocytes during previous lab, likely related to chronic rhinitis. Will repeat CBC before his next visit with me, future lab ordered.

## 2024-06-20 NOTE — Assessment & Plan Note (Signed)
 Check A1c in 6 months, given BMI.

## 2024-06-20 NOTE — Progress Notes (Addendum)
 Established Patient Office Visit TOC from Dr. Hope    Subjective  Patient ID: Alexis Chan, male    DOB: 04-Jul-1951  Age: 73 y.o. MRN: [id number]  Chief Complaint  Patient presents with   Establish Care    He  has a past medical history of Allergy, dysplastic nevus (07/29/2020), dysplastic nevus (07/29/2020), OSA (obstructive sleep apnea) (01/30/2013), Seasonal allergies, and Sleep apnea.  HPI Discussed the use of AI scribe software for clinical note transcription with the patient, who gave verbal consent to proceed.  History of Present Illness Alexis Chan is a 73 year old male with a history of umbilical hernia, presents for transfer of care from previous PCP.   He has a uncomplicated umbilical area. The hernia has not been deemed significant enough to require surgical intervention. Physical therapy has been suggested to address the weakness of the rectus abdominis muscle, which may help reduce the hernia's impact.  He has a history of sleep apnea and uses a CPAP machine nightly for 7 to 9 hours, with excellent compliance noted in his last evaluation in August 2022.  He previously completed treatment for a fungal toenail infection. He uses Flonase  for seasonal allergies, typically twice a day, and takes glucosamine supplements over the counter. He applies metronidazole  gel on his face as needed.  He had multiple polyps removed during a colonoscopy in December of 2024 and is due for a repeat procedure in three years. He has a history of slightly elevated alkaline phosphatase, but other liver enzymes have been normal. He has had a mildly low GFR for nine years, but was not previously aware of a diagnosis of stage 2 kidney disease. He occasionally takes Aleve, which can affect kidney function if used frequently.  He takes a multivitamin and previously took vitamin B12 supplements, which he discontinued after high levels were noted. He adjusted the timing of his multivitamin intake to  the morning after experiencing sleep disturbances when taken at night.  He drinks a third of a gallon of tea daily, with less sugar than typical restaurant servings, and occasionally drinks Gatorade, especially when engaging in physical activity. He limits soft drink consumption to about five times a year.  No ongoing fevers, chills, or unintentional weight loss. No current issues with fungal toenail infection.     ROS As per HPI    Objective:     BP 120/70 (BP Location: Right Arm, Patient Position: Sitting, Cuff Size: Normal)   Pulse 64   Temp 98.9 F (37.2 C) (Oral)   Ht 5' 8 (1.727 m)   Wt 182 lb 12.8 oz (82.9 kg)   SpO2 97%   BMI 27.79 kg/m      06/20/2024   10:15 AM 05/15/2024    3:50 PM 05/07/2024   10:19 AM  Depression screen PHQ 2/9  Decreased Interest 0 0 0  Down, Depressed, Hopeless 0 0 0  PHQ - 2 Score 0 0 0  Altered sleeping 0 0 0  Tired, decreased energy 0 0 0  Change in appetite 0 0 0  Feeling bad or failure about yourself  0 0 0  Trouble concentrating 0 0 0  Moving slowly or fidgety/restless 0 0 0  Suicidal thoughts 0 0 0  PHQ-9 Score 0 0 0  Difficult doing work/chores Not difficult at all Not difficult at all Not difficult at all      06/20/2024   10:15 AM 05/07/2024   10:20 AM 05/03/2023   10:12 AM  GAD 7 : Generalized Anxiety Score  Nervous, Anxious, on Edge 0 0 0  Control/stop worrying 0 0 0  Worry too much - different things 0 0 0  Trouble relaxing 0 0 0  Restless 0 0 0  Easily annoyed or irritable 0 0 0  Afraid - awful might happen 0 0 0  Total GAD 7 Score 0 0 0  Anxiety Difficulty Not difficult at all Not difficult at all Not difficult at all      06/20/2024   10:15 AM 05/15/2024    3:50 PM 05/07/2024   10:19 AM  Depression screen PHQ 2/9  Decreased Interest 0 0 0  Down, Depressed, Hopeless 0 0 0  PHQ - 2 Score 0 0 0  Altered sleeping 0 0 0  Tired, decreased energy 0 0 0  Change in appetite 0 0 0  Feeling bad or failure about  yourself  0 0 0  Trouble concentrating 0 0 0  Moving slowly or fidgety/restless 0 0 0  Suicidal thoughts 0 0 0  PHQ-9 Score 0 0 0  Difficult doing work/chores Not difficult at all Not difficult at all Not difficult at all      06/20/2024   10:15 AM 05/07/2024   10:20 AM 05/03/2023   10:12 AM  GAD 7 : Generalized Anxiety Score  Nervous, Anxious, on Edge 0 0 0  Control/stop worrying 0 0 0  Worry too much - different things 0 0 0  Trouble relaxing 0 0 0  Restless 0 0 0  Easily annoyed or irritable 0 0 0  Afraid - awful might happen 0 0 0  Total GAD 7 Score 0 0 0  Anxiety Difficulty Not difficult at all Not difficult at all Not difficult at all   SDOH Screenings   Food Insecurity: No Food Insecurity (05/15/2024)  Housing: Unknown (05/15/2024)  Transportation Needs: No Transportation Needs (05/15/2024)  Utilities: Not At Risk (05/15/2024)  Alcohol Screen: Low Risk  (05/15/2024)  Depression (PHQ2-9): Low Risk  (06/20/2024)  Financial Resource Strain: Low Risk  (05/15/2024)  Physical Activity: Sufficiently Active (05/15/2024)  Social Connections: Moderately Integrated (05/15/2024)  Stress: No Stress Concern Present (05/15/2024)  Tobacco Use: Low Risk  (06/20/2024)  Health Literacy: Adequate Health Literacy (05/15/2024)     Physical Exam     No results found for any visits on 06/20/24.  The 10-year ASCVD risk score (Arnett DK, et al., 2019) is: 16.6%     Assessment & Plan:   Diastasis of rectus abdominis Assessment & Plan: Patient was evaluated by general surgeon at [facility] in 05/2024 for this. He has umbilical hernia, uncomplicated and not a candidate for surgery. Patient asymptomatic. Requesting physical therapy referral for rectus abdominis muscle strengthening exercise. Referral made. If he prefers a different physical therapist he will reach out to us  and we will put in external referral for this.   Orders: -     Ambulatory referral to Physical Therapy  Umbilical hernia  without obstruction and without gangrene Assessment & Plan: Patient was evaluated by general surgeon at [facility] in 05/2024 for this. He has umbilical hernia, uncomplicated and was recommended non-operative management. Discussed red flag symptoms with the patient including abdominal pain, nausea, vomiting to seek medication care emergently.    Elevated alkaline phosphatase level Assessment & Plan: Noted in previous CMP. Patient asymptomatic. Will monitor liver function intermittently. Future lab to repeat CMP in 6 months ordered.    Stage 2 chronic kidney disease Assessment &  Plan: Stage 2 CKD with mildly low GFR. Monitor kidney function with CMP in six months. Advise to avoid long-term NSAIDs and maintain hydration.  Orders: -     Comprehensive metabolic panel with GFR; Future  Abnormal CBC Assessment & Plan: Elevated monocytes during previous lab, likely related to chronic rhinitis. Will repeat CBC before his next visit with me, future lab ordered.   Orders: -     CBC with Differential/Platelet; Future -     Vitamin B12; Future  Screening for lipoid disorders Assessment & Plan: Check A1c in 6 months, given BMI.   Orders: -     Lipid panel; Future  Overweight (BMI 25.0-29.9) Assessment & Plan: Patient stays active, eats balanced diet. BP within goal. Continue healthy lifestyle.    Chronic rhinitis Assessment & Plan: Symptoms stable on twice daily over the counter nasal Flonase . Continue. Proper use of Flonase  discussed with the patient.    Encounter for screening examination for intermediate hyperglycemia and diabetes mellitus Assessment & Plan: Check A1c in 6 months, given BMI.   Orders: -     Hemoglobin A1c; Future  Lipid screening Assessment & Plan: Has a h/o mildly low HDL and normal LDL. Recommend continue to monitor lipid function intermittently. Future lipid panel ordered, repeat in 6 months.    OSA on CPAP Assessment & Plan: Compliant with use of  CPAP, saw pulmonology in 04/2024 and has new CPAP.      Return in about 6 months (around 12/19/2024) for Chronic follow up with labs couple of days before appointment with me.   Luke Shade, MD

## 2024-06-20 NOTE — Assessment & Plan Note (Signed)
 Noted in previous CMP. Patient asymptomatic. Will monitor liver function intermittently. Future lab to repeat CMP in 6 months ordered.

## 2024-06-20 NOTE — Assessment & Plan Note (Signed)
 Symptoms stable on twice daily over the counter nasal Flonase . Continue. Proper use of Flonase  discussed with the patient.

## 2024-06-20 NOTE — Patient Instructions (Addendum)
-   I am referring you to physical therapy to work on abdominal muscle strengthening. If you do not hear form them in couple of weeks please reach out.  - Follow up in 6 months, labs couple of days before your appointment with me.

## 2024-06-20 NOTE — Assessment & Plan Note (Signed)
 Stage 2 CKD with mildly low GFR. Monitor kidney function with CMP in six months. Advise to avoid long-term NSAIDs and maintain hydration.

## 2024-06-20 NOTE — Assessment & Plan Note (Signed)
 Compliant with use of CPAP, saw pulmonology in 04/2024 and has new CPAP.

## 2024-12-17 ENCOUNTER — Other Ambulatory Visit

## 2024-12-19 ENCOUNTER — Ambulatory Visit

## 2025-03-27 ENCOUNTER — Ambulatory Visit: Admitting: Dermatology

## 2025-05-20 ENCOUNTER — Ambulatory Visit
